# Patient Record
Sex: Female | Born: 1970 | Race: White | Hispanic: No | Smoking: Current every day smoker
Health system: Southern US, Community
[De-identification: ages and names within clinical notes are randomized; demographics above are authoritative.]

## PROBLEM LIST (undated history)

## (undated) DIAGNOSIS — M419 Scoliosis, unspecified: Secondary | ICD-10-CM

## (undated) DIAGNOSIS — K219 Gastro-esophageal reflux disease without esophagitis: Secondary | ICD-10-CM

## (undated) HISTORY — PX: TONSILLECTOMY: SUR1361

---

## 2001-01-20 ENCOUNTER — Emergency Department (HOSPITAL_COMMUNITY): Admission: EM | Admit: 2001-01-20 | Discharge: 2001-01-20 | Payer: Self-pay | Admitting: Emergency Medicine

## 2001-01-20 ENCOUNTER — Encounter: Payer: Self-pay | Admitting: *Deleted

## 2002-11-16 ENCOUNTER — Emergency Department (HOSPITAL_COMMUNITY): Admission: EM | Admit: 2002-11-16 | Discharge: 2002-11-16 | Payer: Self-pay | Admitting: Emergency Medicine

## 2004-08-05 ENCOUNTER — Other Ambulatory Visit: Admission: RE | Admit: 2004-08-05 | Discharge: 2004-08-05 | Payer: Self-pay

## 2005-09-15 ENCOUNTER — Other Ambulatory Visit: Admission: RE | Admit: 2005-09-15 | Discharge: 2005-09-15 | Payer: Self-pay | Admitting: Unknown Physician Specialty

## 2005-09-15 ENCOUNTER — Encounter (INDEPENDENT_AMBULATORY_CARE_PROVIDER_SITE_OTHER): Payer: Self-pay | Admitting: *Deleted

## 2005-09-15 ENCOUNTER — Encounter (INDEPENDENT_AMBULATORY_CARE_PROVIDER_SITE_OTHER): Payer: Self-pay | Admitting: Specialist

## 2007-07-04 ENCOUNTER — Emergency Department (HOSPITAL_COMMUNITY): Admission: EM | Admit: 2007-07-04 | Discharge: 2007-07-05 | Payer: Self-pay | Admitting: Emergency Medicine

## 2009-04-03 ENCOUNTER — Emergency Department (HOSPITAL_COMMUNITY): Admission: EM | Admit: 2009-04-03 | Discharge: 2009-04-04 | Payer: Self-pay | Admitting: Emergency Medicine

## 2010-04-14 LAB — POCT CARDIAC MARKERS
CKMB, poc: <1 ng/mL — ABNORMAL LOW (ref 1.0–8.0)
CKMB, poc: <1 ng/mL — ABNORMAL LOW (ref 1.0–8.0)
Myoglobin, poc: 31.8 ng/mL (ref 12–200)
Myoglobin, poc: 36.9 ng/mL (ref 12–200)
Troponin i, poc: <0.05 ng/mL (ref 0.00–0.09)
Troponin i, poc: <0.05 ng/mL (ref 0.00–0.09)

## 2010-09-10 ENCOUNTER — Other Ambulatory Visit (HOSPITAL_COMMUNITY): Payer: Self-pay | Admitting: Family Medicine

## 2010-09-10 DIAGNOSIS — Z139 Encounter for screening, unspecified: Secondary | ICD-10-CM

## 2010-09-25 ENCOUNTER — Inpatient Hospital Stay (HOSPITAL_COMMUNITY): Admission: RE | Admit: 2010-09-25 | Payer: Self-pay | Source: Ambulatory Visit

## 2010-12-18 ENCOUNTER — Emergency Department (HOSPITAL_COMMUNITY): Payer: Self-pay

## 2010-12-18 ENCOUNTER — Encounter: Payer: Self-pay | Admitting: Emergency Medicine

## 2010-12-18 ENCOUNTER — Emergency Department (HOSPITAL_COMMUNITY)
Admission: EM | Admit: 2010-12-18 | Discharge: 2010-12-18 | Disposition: A | Discharge status: Home / Self Care | Payer: Self-pay | Attending: Emergency Medicine | Admitting: Emergency Medicine

## 2010-12-18 DIAGNOSIS — R079 Chest pain, unspecified: Secondary | ICD-10-CM | POA: Insufficient documentation

## 2010-12-18 DIAGNOSIS — J029 Acute pharyngitis, unspecified: Secondary | ICD-10-CM | POA: Insufficient documentation

## 2010-12-18 DIAGNOSIS — R062 Wheezing: Secondary | ICD-10-CM | POA: Insufficient documentation

## 2010-12-18 DIAGNOSIS — J40 Bronchitis, not specified as acute or chronic: Secondary | ICD-10-CM | POA: Insufficient documentation

## 2010-12-18 DIAGNOSIS — R059 Cough, unspecified: Secondary | ICD-10-CM | POA: Insufficient documentation

## 2010-12-18 DIAGNOSIS — R05 Cough: Secondary | ICD-10-CM | POA: Insufficient documentation

## 2010-12-18 DIAGNOSIS — IMO0001 Reserved for inherently not codable concepts without codable children: Secondary | ICD-10-CM | POA: Insufficient documentation

## 2010-12-18 DIAGNOSIS — R509 Fever, unspecified: Secondary | ICD-10-CM | POA: Insufficient documentation

## 2010-12-18 DIAGNOSIS — J3489 Other specified disorders of nose and nasal sinuses: Secondary | ICD-10-CM | POA: Insufficient documentation

## 2010-12-18 DIAGNOSIS — R0602 Shortness of breath: Secondary | ICD-10-CM | POA: Insufficient documentation

## 2010-12-18 MED ORDER — HYDROCOD POLST-CHLORPHEN POLST 10-8 MG/5ML PO LQCR: 5.0000 mL | Freq: Two times a day (BID) | ORAL | Status: DC

## 2010-12-18 MED ORDER — AZITHROMYCIN 250 MG PO TABS: 500.0000 mg | ORAL_TABLET | Freq: Once | ORAL | Status: AC

## 2010-12-18 NOTE — ED Provider Notes (Signed)
History     CSN: [id number] Arrival date & time: 12/18/2010  8:15 AM   First MD Initiated Contact with Patient 12/18/10 0830      Chief Complaint  Patient presents with  . Cough    (Consider location/radiation/quality/duration/timing/severity/associated sxs/prior treatment) HPI Comments: Patient here with worsening shortness of breath, cough and back and chest pain for the past 2 weeks - states that she has been trying her inhaler at home without relief of symptoms - states that she believes she had a fever about 3 days ago - none now.  Patient is a 40 y.o. female presenting with cough. The history is provided by the patient. No language interpreter was used.  Cough This is a new problem. The current episode started more than 1 week ago. The problem occurs every few hours. The problem has been gradually worsening. The cough is productive of sputum. The maximum temperature recorded prior to her arrival was 100 to 100.9 F. The fever has been present for 1 to 2 days. Associated symptoms include chest pain, rhinorrhea, sore throat, myalgias, shortness of breath and wheezing. Pertinent negatives include no chills, no sweats, no weight loss, no ear congestion and no headaches. She has tried cough syrup for the symptoms. The treatment provided no relief. She is a smoker. Her past medical history is significant for COPD and asthma.    No past medical history on file.  No past surgical history on file.  No family history on file.  History  Substance Use Topics  . Smoking status: Not on file  . Smokeless tobacco: Not on file  . Alcohol Use: Not on file    OB History    Grav Para Term Preterm Abortions TAB SAB Ect Mult Living                  Review of Systems  Constitutional: Negative for chills and weight loss.  HENT: Positive for sore throat and rhinorrhea.   Respiratory: Positive for cough, shortness of breath and wheezing.   Cardiovascular: Positive for chest pain.    Musculoskeletal: Positive for myalgias.  Neurological: Negative for headaches.  All other systems reviewed and are negative.    Allergies  Review of patient's allergies indicates no known allergies.  Home Medications   Current Outpatient Rx  Name Route Sig Dispense Refill  . IPRATROPIUM-ALBUTEROL 0.5-2.5 (3) MG/3ML IN SOLN Nebulization Take 3 mLs by nebulization every 6 (six) hours as needed. For shortness of breath     . ORTHO TRI-CYCLEN (28) PO Oral Take 1 tablet by mouth daily.      Doreatha Martin MULTI-SYMPTOM PO Oral Take 2 tablets by mouth at bedtime as needed. For cough/cold symptoms       BP 101/63  Pulse 73  Temp(Src) 97.7 F (36.5 C) (Oral)  Resp 16  Ht 5\' 11"  (1.803 m)  Wt 148 lb (67.132 kg)  BMI 20.64 kg/m2  SpO2 96%  LMP 08/17/2010  Physical Exam  Nursing note and vitals reviewed. Constitutional: She is oriented to person, place, and time. She appears well-developed and well-nourished.  HENT:  Head: Normocephalic and atraumatic.  Right Ear: External ear normal.  Left Ear: External ear normal.  Mouth/Throat: Oropharynx is clear and moist.  Eyes: Conjunctivae are normal. Pupils are equal, round, and reactive to light. No scleral icterus.  Neck: Normal range of motion. Neck supple.  Cardiovascular: Normal rate, regular rhythm, normal heart sounds and intact distal pulses.   Pulmonary/Chest: Effort normal and breath sounds  normal. No respiratory distress. She has no wheezes. She has no rales. She exhibits tenderness.  Abdominal: Soft. Bowel sounds are normal. There is no tenderness.  Musculoskeletal: Normal range of motion. She exhibits no edema and no tenderness.  Neurological: She is alert and oriented to person, place, and time. No cranial nerve deficit.  Skin: Skin is warm and dry. She is not diaphoretic.  Psychiatric: She has a normal mood and affect. Her behavior is normal. Judgment and thought content normal.    ED Course  Procedures (including critical  care time)  Labs Reviewed - No data to display Dg Chest 2 View  12/18/2010  *RADIOLOGY REPORT*  Clinical Data: Cough, fever, posterior chest pain, smoker  CHEST - 2 VIEW  Comparison: 04/04/2009  Findings: Normal heart size, mediastinal contours, and pulmonary vascularity. Hyperinflation and mild chronic peribronchial thickening. No pulmonary infiltrate, pleural effusion, or pneumothorax. No acute osseous findings.  IMPRESSION: Hyperinflation and chronic bronchitic changes, question COPD. No acute infiltrate.  Original Report Authenticated By: Lollie Marrow, M.D.     Bronchitis   MDM  Heavy smoker with a history of COPD presents with worsening cough over past 2 weeks - chest x-ray shows no pneumonia, bronchitis - placed on z-pak and tussionex for cough        Scarlette Calico C. [place], [place] 12/18/10 1101

## 2010-12-18 NOTE — ED Provider Notes (Signed)
Medical screening examination/treatment/procedure(s) were performed by non-physician practitioner and as supervising physician I was immediately available for consultation/collaboration.   Dorette Hartel L Masiah Lewing, MD 12/18/10 1500 

## 2010-12-18 NOTE — ED Notes (Signed)
Pt states for past two weeks she has been coughing with some fevers and now has pain in her middle back.

## 2011-10-08 ENCOUNTER — Other Ambulatory Visit (HOSPITAL_COMMUNITY): Payer: Self-pay | Admitting: Nurse Practitioner

## 2011-10-08 DIAGNOSIS — Z139 Encounter for screening, unspecified: Secondary | ICD-10-CM

## 2011-10-19 ENCOUNTER — Ambulatory Visit (HOSPITAL_COMMUNITY)
Admission: RE | Admit: 2011-10-19 | Discharge: 2011-10-19 | Disposition: A | Discharge status: Home / Self Care | Payer: PRIVATE HEALTH INSURANCE | Source: Ambulatory Visit | Attending: Nurse Practitioner | Admitting: Nurse Practitioner

## 2011-10-19 DIAGNOSIS — Z139 Encounter for screening, unspecified: Secondary | ICD-10-CM

## 2013-03-13 ENCOUNTER — Emergency Department (HOSPITAL_COMMUNITY)
Admission: EM | Admit: 2013-03-13 | Discharge: 2013-03-13 | Disposition: A | Discharge status: Home / Self Care | Payer: Self-pay | Attending: Emergency Medicine | Admitting: Emergency Medicine

## 2013-03-13 ENCOUNTER — Encounter (HOSPITAL_COMMUNITY): Payer: Self-pay | Admitting: Emergency Medicine

## 2013-03-13 DIAGNOSIS — F17200 Nicotine dependence, unspecified, uncomplicated: Secondary | ICD-10-CM | POA: Insufficient documentation

## 2013-03-13 DIAGNOSIS — Z79899 Other long term (current) drug therapy: Secondary | ICD-10-CM | POA: Insufficient documentation

## 2013-03-13 DIAGNOSIS — M545 Low back pain, unspecified: Secondary | ICD-10-CM | POA: Insufficient documentation

## 2013-03-13 DIAGNOSIS — G8929 Other chronic pain: Secondary | ICD-10-CM | POA: Insufficient documentation

## 2013-03-13 DIAGNOSIS — M549 Dorsalgia, unspecified: Secondary | ICD-10-CM

## 2013-03-13 HISTORY — DX: Scoliosis, unspecified: M41.9

## 2013-03-13 MED ORDER — HYDROCODONE-ACETAMINOPHEN 5-325 MG PO TABS: 1.0000 | ORAL_TABLET | ORAL | Status: DC | PRN

## 2013-03-13 MED ORDER — CYCLOBENZAPRINE HCL 5 MG PO TABS: 5.0000 mg | ORAL_TABLET | Freq: Three times a day (TID) | ORAL | Status: DC | PRN

## 2013-03-13 NOTE — Discharge Instructions (Signed)
Chronic Back Pain  When back pain lasts longer than 3 months, it is called chronic back pain.People with chronic back pain often go through certain periods that are more intense (flare-ups).  CAUSES Chronic back pain can be caused by wear and tear (degeneration) on different structures in your back. These structures include:  The bones of your spine (vertebrae) and the joints surrounding your spinal cord and nerve roots (facets).  The strong, fibrous tissues that connect your vertebrae (ligaments). Degeneration of these structures may result in pressure on your nerves. This can lead to constant pain. HOME CARE INSTRUCTIONS  Avoid bending, heavy lifting, prolonged sitting, and activities which make the problem worse.  Take brief periods of rest throughout the day to reduce your pain. Lying down or standing usually is better than sitting while you are resting.  Take over-the-counter or prescription medicines only as directed by your caregiver. SEEK IMMEDIATE MEDICAL CARE IF:   You have weakness or numbness in one of your legs or feet.  You have trouble controlling your bladder or bowels.  You have nausea, vomiting, abdominal pain, shortness of breath, or fainting. Document Released: 02/13/2004 Document Revised: 03/30/2011 Document Reviewed: 12/20/2010 [facility] Patient Information 2014 [place], [place].    Emergency Department Resource Guide 1) Find a Doctor and Pay Out of Pocket Although you won't have to find out who is covered by your insurance plan, it is a good idea to ask around and get recommendations. You will then need to call the office and see if the doctor you have chosen will accept you as a new patient and what types of options they offer for patients who are self-pay. Some doctors offer discounts or will set up payment plans for their patients who do not have insurance, but you will need to ask so you aren't surprised when you get to your appointment.  2) Contact Your  Local Health Department Not all health departments have doctors that can see patients for sick visits, but many do, so it is worth a call to see if yours does. If you don't know where your local health department is, you can check in your phone book. The CDC also has a tool to help you locate your state's health department, and many state websites also have listings of all of their local health departments.  3) Find a [facility] If your illness is not likely to be very severe or complicated, you may want to try a walk in clinic. These are popping up all over the country in pharmacies, drugstores, and shopping centers. They're usually staffed by nurse practitioners or physician assistants that have been trained to treat common illnesses and complaints. They're usually fairly quick and inexpensive. However, if you have serious medical issues or chronic medical problems, these are probably not your best option.  No Primary Care Doctor: - Call Health Connect at  ([phone] - they can help you locate a primary care doctor that  accepts your insurance, provides certain services, etc. - Physician Referral Service- [phone]  Chronic Pain Problems: Organization         Address  Phone   Notes  [facility]  [phone] Patients need to be referred by their primary care doctor.   Medication Assistance: Organization         Address  Phone   Notes  [facility] Medication [facility] [address] [place], [place] [address] [phone] --Must be a resident of [facility] --  Must have NO insurance coverage whatsoever (no Medicaid/ Medicare, etc.) °-- The pt. MUST have a primary care doctor that directs their care regularly and follows them in the community °  °MedAssist  (866) 331-1348   °[employer]  (888) 892-1162   ° °Agencies that provide inexpensive medical care: °Organization         Address  Phone   Notes  °[facility]  (336) 832-8035    °[facility]    (336) 832-7272   °[facility] [address] °[place], [name] [address] (336) 832-4777   °[facility] [place] [address], °[place] (336) 271-4999   °[facility]    (336) 373-0678   °[facility]    (336) 272-1050   °[facility] ° [address], [place] Phone:  (336) 832-4444, Fax:  (336) 832-4440 Hours of Operation:  9 am - 6 pm, M-F.  Also accepts Medicaid/Medicare and self-pay.  °[facility] ° [address], [place] Phone: (336) [phone], Fax: (336) [phone]. Hours of Operation:  8:30 am - 5:30 pm, M-F.  Also accepts Medicaid and self-pay.  °[facility] [place] [address], [place] Phone: (336) 878-6027   °[facility] [address], [place], [place] (336)723-1848, Ext. 123 Mondays & Thursdays: 7-9 AM.  First 15 patients are seen on a first come, first serve basis. °  ° °Medicaid-accepting [place] Providers: ° °Organization         Address  Phone   Notes  °[facility] [address], [place] ([phone]) [phone] Also accepts self-pay patients.  °[facility] [address], [place] ° (336) 856-9996   °[facility] [address], [place] (336) 288-8857   °[facility] [address] [place] [address], [place] (336) 299-7000   °Ve[facility] Bland [address], [place]  ° (336) [phone] Only accepts [place] Access Medicaid patients after they have their name applied to their card.  ° °Self-Pay (no insurance) in [place]: ° °Organization         Address  Phone   Notes  °Sickle Cell Patients, [facility] [address], [place] (336) 832-1970   °[facility] [address], [place] (336) 832-4400   °[facility] ° [address],  [place] (336) 992-4800   °[facility]/Dr. Osei-Bonsu ° [address] [place] [address], [place] or [address], [place] (336) [phone] Phone number for both [place] and [place] locations is the same.  °[facility] [address], [place] (336) [phone]   °[facility] [address] [place] [address], [place] or [address] (336) 852-7530 °(336) 878-2260   °[facility] [address],  (336) 350-1642, phone; (336) 294-5005, fax Sees patients 1st and 3rd Saturday of every month.  Must not qualify for public or private insurance (i.e. Medicaid, Medicare, [place] Health Choice, Veterans' Benefits) • Household income should be no more than 200% of the poverty level •The clinic cannot treat you if you are pregnant or think you are pregnant • Sexually transmitted diseases are not treated at the clinic.  ° ° °Dental Care: °Organization         Address  Phone  Notes  °  [place] [facility] [address], [place] (336) 641-6152 Accepts children up to age 21 who are enrolled in Medicaid or [place] Health Choice; pregnant women with a Medicaid card; and children who have applied for Medicaid or [place] Health Choice, but were declined, whose parents can pay a reduced fee at time of service.  °[place] [facility] [place]  [address], [place] (336) 641-7733 Accepts children up to age 21 who are enrolled in Medicaid or [place] Health Choice; pregnant women with a Medicaid card; and children who have applied for Medicaid or [place] Health Choice, but were declined, whose parents can pay a reduced fee at time of service.  °[facility] ° [address], [place] (336) 641-4533 Patients are seen by appointment only. Walk-ins are not accepted. [facility] will see patients 18 years of age and older. °Monday - Tuesday (8am-5pm) °Most Wednesdays  (8:30-5pm) °$30 per visit, cash only  °[facility] ° [address], [place] (336) 641-4533 Patients are seen by appointment only. Walk-ins are not accepted. [facility] will see patients 18 years of age and older. °One Wednesday Evening (Monthly: Volunteer Based).  $30 per visit, cash only  °[facility]  (919) [phone] for adults; Children under age 4, call Graduate Pediatric Dentistry at ([phone]) [phone]. Children aged 4-14, please call ([phone]) [phone] to request a pediatric application. ° Dental services are provided in all areas of dental care including fillings, crowns and bridges, complete and partial dentures, implants, gum treatment, root canals, and extractions. Preventive care is also provided. Treatment is provided to both adults and children. °Patients are selected via a lottery and there is often a waiting list. °  °[facility] [address], °[place] ° (336) 763-8833 [email or web address] °  °[facility] [address], [place], [place] (336)723-1848, Ext. 123 Second and Fourth Thursday of each month, opens at 6:30 AM; Clinic ends at 9 AM.  Patients are seen on a first-come first-served basis, and a limited number are seen during each clinic.  ° °[facility] ° [address], [place], [place] (336) [phone]   Eligibility Requirements °You must have lived in [place], [place], or [place] counties for at least the last three months. °  You cannot be eligible for state or federal sponsored healthcare insurance, including Veterans Administration, Medicaid, or Medicare. °  You generally cannot be eligible for healthcare insurance through your employer.  °  How to apply: °Eligibility screenings are held every Tuesday and Wednesday afternoon from 1:00 pm until 4:00 pm. You do not need an appointment for the interview!  °[facility] [address], [place], [place] 336-631-2330   °[place]  [facility]  336-342-8273   °[place] [facility]  336-703-3100   °[facility]  336-570-6415   ° °Behavioral Health Resources in the Community: °Intensive Outpatient Programs °Organization         Address  Phone  Notes  °[facility] [address], [place], [place] 336-878-6098   °[facility] [address], [place], [place] 336-832-9800   °[facility] [address], [place], [place] ° 336-882-2125   °[place] [address],  °,  1-800-853-5163 or 336-641-4981   °Substance Abuse Resources °Organization           Address  Phone  Notes  [facility]  443-850-0693   [place]  8571590729   [facility]  724-776-5010   Chinita Pester  845-231-6061   Residential & Outpatient Substance Abuse Program  917 117 6021   Psychological Services Organization         Address  Phone  Notes  [facility] [place]  [place]  (801)833-6500   [place] [address] [place] or (616)624-8946    Mobile Crisis Teams Organization         Address  Phone  Notes  [facility], Mobile Crisis Care Unit  325-818-5470   [facility]  [address] [place], [place]   [place] [address] [place] [phone]    Self-Help/Support Groups Organization         Address  Phone             Notes  [place] [place] - variety of support groups  [place] Call for more information  Narcotics Anonymous (NA), [facility] [address], [employer]  2 meetings at this location   Special educational needs teacher         Address  Phone  Notes  [facility] [place],    [place]  1-641-664-4066   [facility]  [address], [place]  [address], [place], [place]   [place], [place] [phone] Admissions: 8am-3pm M-F  [facility] [place] [address]    [place], [place] [phone]   [facility] [address] [place], [place], [place]   [facility] [address]  [place], [place]   [facility] - Intensive Outpatient [address], [place], [place]   [facility] ([address]  [place], [place] 1-(321)477-6150 or (616)615-0335   [facility] ([facility]) [address], [place], [place] Accepts Medicaid  [facility] [place] [address],  [place] 1-934-659-5904 Substance Abuse/Addiction Treatment   [facility] Organization         Address  Phone  Notes  [facility]  223-603-9684   Domenic Schwab, PhD [address] [place], [place]   (279) 629-4833 or [phone]   [place], [place] (205) 776-1576   [facility] [address] [place], [place] [phone] Insurance/Medicaid/sponsorship through [facility] [address]                                    [place], [place] [phone] [place] [address], [place] [phone]    Dr. Adele Schilder  365-267-8854   [facility] Dept. 1) [address], [place] 2) [place] 3)  [address], [place] [phone]  [phone]   [place] (434)[phone] or 512-107-7590 (After Hours)          Do not drive within 4 hours of taking hydrocodone as this will make you drowsy.  Avoid lifting,  Bending,  Twisting or any other activity that worsens your pain over the next  week.  Apply a heating pad to your neck and shoulders for 20 minutes several times daily.

## 2013-03-13 NOTE — ED Notes (Signed)
Pt states she was dx with scoliosis x 14 years ago and has not done anything about it. C/o chronic neck/back/hip pain.

## 2013-03-15 NOTE — ED Provider Notes (Signed)
CSN: [id number]     Arrival date & time 03/13/13  1701 History   First MD Initiated Contact with Patient 03/13/13 1839     Chief Complaint  Patient presents with  . Scoliosis     (Consider location/radiation/quality/duration/timing/severity/associated sxs/prior Treatment) HPI Comments: Suzanne Baker is a 43 y.o. Female with a history of chronic low back pain which she states is from scoliosis, diagnosed approximately 14 years ago.  She denies chronic daily pain in her neck, lower back with radiation into her lower buttocks when it gets severe.  Her pain is worse with movement and certain positions.  She takes occasional ibuprofen which does not relieve her pain.  She denies fevers or chills, weakness or numbness in her legs and has had no urinary or bowel incontinence or retention.  She denies any new symptoms tonight, she is just tired of hurting.  She does not have a primary doctor.    The history is provided by the patient.    Past Medical History  Diagnosis Date  . Scoliosis    Past Surgical History  Procedure Laterality Date  . Tonsillectomy     No family history on file. History  Substance Use Topics  . Smoking status: Current Every Day Smoker -- 1.00 packs/day    Types: Cigarettes  . Smokeless tobacco: Not on file  . Alcohol Use: No   OB History   Grav Para Term Preterm Abortions TAB SAB Ect Mult Living                 Review of Systems  Constitutional: Negative for fever.  Respiratory: Negative for shortness of breath.   Cardiovascular: Negative for chest pain and leg swelling.  Gastrointestinal: Negative for abdominal pain, constipation and abdominal distention.  Genitourinary: Negative for dysuria, urgency, frequency, flank pain and difficulty urinating.  Musculoskeletal: Positive for back pain. Negative for gait problem and joint swelling.  Skin: Negative for rash.  Neurological: Negative for weakness and numbness.      Allergies  Review of patient's  allergies indicates no known allergies.  Home Medications   Current Outpatient Rx  Name  Route  Sig  Dispense  Refill  . Norgestim-Eth Estrad Triphasic (ORTHO TRI-CYCLEN, 28, PO)   Oral   Take 1 tablet by mouth daily.           . cyclobenzaprine (FLEXERIL) 5 MG tablet   Oral   Take 1 tablet (5 mg total) by mouth 3 (three) times daily as needed for muscle spasms.   15 tablet   0   . HYDROcodone-acetaminophen (NORCO/VICODIN) 5-325 MG per tablet   Oral   Take 1 tablet by mouth every 4 (four) hours as needed for moderate pain.   20 tablet   0    BP 122/88  Pulse 77  Temp(Src) 98 F (36.7 C) (Oral)  Resp 18  Ht 5' 6.5" (1.689 m)  Wt 165 lb (74.844 kg)  BMI 26.24 kg/m2  SpO2 98%  LMP 03/06/2013 Physical Exam  Nursing note and vitals reviewed. Constitutional: She appears well-developed and well-nourished.  HENT:  Head: Normocephalic.  Eyes: Conjunctivae are normal.  Neck: Normal range of motion. Neck supple.  Cardiovascular: Normal rate and intact distal pulses.   Pedal pulses normal.  Pulmonary/Chest: Effort normal.  Abdominal: Soft. Bowel sounds are normal. She exhibits no distension and no mass.  Musculoskeletal: Normal range of motion. She exhibits no edema.       Thoracic back: She exhibits tenderness. She exhibits  no swelling, no edema and no spasm.       Lumbar back: She exhibits tenderness. She exhibits no swelling, no edema and no spasm.  paralumbar and parathoracic tenderness.   Neurological: She is alert. She has normal strength. She displays no atrophy and no tremor. No sensory deficit. Gait normal.  Reflex Scores:      Patellar reflexes are 2+ on the right side and 2+ on the left side.      Achilles reflexes are 2+ on the right side and 2+ on the left side. No strength deficit noted in hip and knee flexor and extensor muscle groups.  Ankle flexion and extension intact.  Skin: Skin is warm and dry.  Psychiatric: She has a normal mood and affect.    ED  Course  Procedures (including critical care time) Labs Review Labs Reviewed - No data to display Imaging Review No results found.  EKG Interpretation   None       MDM   Final diagnoses:  Chronic back pain    Pt advised she needs to obtain a pcp.  She was prescribed flexeril, hydrocodone.  Encouraged ibuprofen 600 mg qid (pt has). Heat therapy.  No neuro deficit on exam or by history to suggest emergent or surgical presentation.  Also discussed worsened sx that should prompt immediate re-evaluation including distal weakness, bowel/bladder retention/incontinence.         Evalee Jefferson, PA-C 03/15/13 2354

## 2013-03-17 NOTE — ED Provider Notes (Signed)
Medical screening examination/treatment/procedure(s) were performed by non-physician practitioner and as supervising physician I was immediately available for consultation/collaboration.  EKG Interpretation  None  Rolland Porter, MD, Abram Sander   Janice Norrie, MD 03/17/13 [phone]

## 2014-11-05 ENCOUNTER — Other Ambulatory Visit (HOSPITAL_COMMUNITY): Payer: Self-pay | Admitting: Nurse Practitioner

## 2014-11-05 DIAGNOSIS — Z1231 Encounter for screening mammogram for malignant neoplasm of breast: Secondary | ICD-10-CM

## 2014-11-12 ENCOUNTER — Ambulatory Visit (HOSPITAL_COMMUNITY): Payer: Self-pay

## 2016-08-13 ENCOUNTER — Emergency Department (HOSPITAL_COMMUNITY)
Admission: EM | Admit: 2016-08-13 | Discharge: 2016-08-13 | Disposition: A | Discharge status: Home / Self Care | Payer: Self-pay | Attending: Emergency Medicine | Admitting: Emergency Medicine

## 2016-08-13 ENCOUNTER — Encounter (HOSPITAL_COMMUNITY): Payer: Self-pay | Admitting: Emergency Medicine

## 2016-08-13 ENCOUNTER — Emergency Department (HOSPITAL_COMMUNITY): Payer: Self-pay

## 2016-08-13 DIAGNOSIS — R112 Nausea with vomiting, unspecified: Secondary | ICD-10-CM | POA: Insufficient documentation

## 2016-08-13 DIAGNOSIS — Z793 Long term (current) use of hormonal contraceptives: Secondary | ICD-10-CM | POA: Insufficient documentation

## 2016-08-13 DIAGNOSIS — F17210 Nicotine dependence, cigarettes, uncomplicated: Secondary | ICD-10-CM | POA: Insufficient documentation

## 2016-08-13 DIAGNOSIS — R1010 Upper abdominal pain, unspecified: Secondary | ICD-10-CM | POA: Insufficient documentation

## 2016-08-13 LAB — URINALYSIS, ROUTINE W REFLEX MICROSCOPIC
Glucose, UA: NEGATIVE mg/dL
HGB URINE DIPSTICK: NEGATIVE
Ketones, ur: 5 mg/dL — AB
LEUKOCYTES UA: NEGATIVE
NITRITE: NEGATIVE
PH: 5 (ref 5.0–8.0)
Protein, ur: 30 mg/dL — AB
SPECIFIC GRAVITY, URINE: 1.028 (ref 1.005–1.030)

## 2016-08-13 LAB — CBC
HCT: 41 % (ref 36.0–46.0)
Hemoglobin: 14.3 g/dL (ref 12.0–15.0)
MCH: 32.7 pg (ref 26.0–34.0)
MCHC: 34.9 g/dL (ref 30.0–36.0)
MCV: 93.8 fL (ref 78.0–100.0)
PLATELETS: 266 10*3/uL (ref 150–400)
RBC: 4.37 MIL/uL (ref 3.87–5.11)
RDW: 13.1 % (ref 11.5–15.5)
WBC: 11.2 10*3/uL — AB (ref 4.0–10.5)

## 2016-08-13 LAB — LIPASE, BLOOD: LIPASE: 37 U/L (ref 11–51)

## 2016-08-13 LAB — COMPREHENSIVE METABOLIC PANEL
ALT: 9 U/L — ABNORMAL LOW (ref 14–54)
AST: 15 U/L (ref 15–41)
Albumin: 4.2 g/dL (ref 3.5–5.0)
Alkaline Phosphatase: 76 U/L (ref 38–126)
Anion gap: 9 (ref 5–15)
BUN: 17 mg/dL (ref 6–20)
CHLORIDE: 105 mmol/L (ref 101–111)
CO2: 26 mmol/L (ref 22–32)
Calcium: 9 mg/dL (ref 8.9–10.3)
Creatinine, Ser: 0.89 mg/dL (ref 0.44–1.00)
GFR calc Af Amer: >60 mL/min (ref >60)
Glucose, Bld: 102 mg/dL — ABNORMAL HIGH (ref 65–99)
POTASSIUM: 3.6 mmol/L (ref 3.5–5.1)
SODIUM: 140 mmol/L (ref 135–145)
Total Bilirubin: 0.7 mg/dL (ref 0.3–1.2)
Total Protein: 7.5 g/dL (ref 6.5–8.1)

## 2016-08-13 LAB — I-STAT CHEM 8, ED
BUN: 17 mg/dL (ref 6–20)
CALCIUM ION: 1.07 mmol/L — AB (ref 1.15–1.40)
CREATININE: 0.8 mg/dL (ref 0.44–1.00)
Chloride: 103 mmol/L (ref 101–111)
Glucose, Bld: 99 mg/dL (ref 65–99)
HEMATOCRIT: 44 % (ref 36.0–46.0)
Hemoglobin: 15 g/dL (ref 12.0–15.0)
Potassium: 3.5 mmol/L (ref 3.5–5.1)
Sodium: 141 mmol/L (ref 135–145)
TCO2: 26 mmol/L (ref 0–100)

## 2016-08-13 MED ORDER — SODIUM CHLORIDE 0.9 % IV BOLUS (SEPSIS)
1000.0000 mL | Freq: Once | INTRAVENOUS | Status: AC
  Administered 2016-08-13: 1000 mL via INTRAVENOUS

## 2016-08-13 MED ORDER — ONDANSETRON 4 MG PO TBDP: ORAL_TABLET | ORAL | 0 refills | Status: DC

## 2016-08-13 MED ORDER — ONDANSETRON HCL 4 MG/2ML IJ SOLN
4.0000 mg | Freq: Once | INTRAMUSCULAR | Status: AC
  Administered 2016-08-13: 4 mg via INTRAVENOUS
  Filled 2016-08-13: qty 2

## 2016-08-13 MED ORDER — FAMOTIDINE IN NACL 20-0.9 MG/50ML-% IV SOLN
20.0000 mg | Freq: Once | INTRAVENOUS | Status: AC
  Administered 2016-08-13: 20 mg via INTRAVENOUS
  Filled 2016-08-13: qty 50

## 2016-08-13 MED ORDER — GI COCKTAIL ~~LOC~~
30.0000 mL | Freq: Once | ORAL | Status: AC
  Administered 2016-08-13: 30 mL via ORAL
  Filled 2016-08-13: qty 30

## 2016-08-13 MED ORDER — GLUCAGON HCL RDNA (DIAGNOSTIC) 1 MG IJ SOLR
2.0000 mg | Freq: Once | INTRAMUSCULAR | Status: AC
  Administered 2016-08-13: 2 mg via INTRAVENOUS
  Filled 2016-08-13: qty 2

## 2016-08-13 NOTE — ED Provider Notes (Signed)
[facility] DEPT Provider Note   CSN: [id number] Arrival date & time: 08/13/16  0904     History   Chief Complaint Chief Complaint  Patient presents with  . Emesis  . Abdominal Pain    HPI Suzanne Baker is a 46 y.o. female.  46 yo F with a cc of chest pain. This occurs when she eats or drinks anything. Feels a burning sharp pain to her sternal area. Has had multiple episodes of vomiting as well. Denies fevers or chills. After she vomits she does get a bit sweaty. Denies exertional symptoms. Has not wanted to eat anything for the past couple days because she is worried it will make it worse. Is able to drink some fluids but thinks she's been vomiting that up as well. Has had similar pains in the past. Has tried a couple Zantac but thinks she vomited that as well.   The history is provided by the patient.  Emesis   Associated symptoms include abdominal pain. Pertinent negatives include no arthralgias, no chills, no fever, no headaches and no myalgias.  Abdominal Pain   Associated symptoms include nausea and vomiting. Pertinent negatives include fever, dysuria, headaches, arthralgias and myalgias.  Illness  This is a new problem. The current episode started 2 days ago. The problem occurs constantly. The problem has not changed since onset.Associated symptoms include abdominal pain. Pertinent negatives include no chest pain, no headaches and no shortness of breath. Nothing aggravates the symptoms. Nothing relieves the symptoms. She has tried nothing for the symptoms. The treatment provided no relief.    Past Medical History:  Diagnosis Date  . Scoliosis     There are no active problems to display for this patient.   Past Surgical History:  Procedure Laterality Date  . TONSILLECTOMY      OB History    No data available       Home Medications    Prior to Admission medications   Medication Sig Start Date End Date Taking? Authorizing Provider  naproxen sodium (ANAPROX)  220 MG tablet Take 220 mg by mouth 2 (two) times daily as needed (for pain).   Yes [provider]  Norgestim-Eth Estrad Triphasic (ORTHO TRI-CYCLEN, 28, PO) Take 1 tablet by mouth daily.     Yes [provider]  ranitidine (ZANTAC) 150 MG tablet Take 150-300 mg by mouth 2 (two) times daily as needed for heartburn.   Yes [provider]  ondansetron (ZOFRAN ODT) 4 MG disintegrating tablet 4mg  ODT q4 hours prn nausea/vomit 08/13/16   Deno Etienne, DO    Family History No family history on file.  Social History Social History  Substance Use Topics  . Smoking status: Current Every Day Smoker    Packs/day: 1.00    Types: Cigarettes  . Smokeless tobacco: Not on file  . Alcohol use No     Allergies   Patient has no known allergies.   Review of Systems Review of Systems  Constitutional: Negative for chills and fever.  HENT: Negative for congestion and rhinorrhea.   Eyes: Negative for redness and visual disturbance.  Respiratory: Negative for shortness of breath and wheezing.   Cardiovascular: Negative for chest pain and palpitations.  Gastrointestinal: Positive for abdominal pain, nausea and vomiting.  Genitourinary: Negative for dysuria and urgency.  Musculoskeletal: Negative for arthralgias and myalgias.  Skin: Negative for pallor and wound.  Neurological: Negative for dizziness and headaches.     Physical Exam Updated Vital Signs BP (!) 138/103 (BP  Location: Left Arm)   Pulse 63   Temp 98.3 F (36.8 C) (Oral)   Resp 18   SpO2 99%   Physical Exam  Constitutional: She is oriented to person, place, and time. She appears well-developed and well-nourished. No distress.  HENT:  Head: Normocephalic and atraumatic.  Eyes: Pupils are equal, round, and reactive to light. EOM are normal.  Neck: Normal range of motion. Neck supple.  Cardiovascular: Normal rate and regular rhythm.  Exam reveals no gallop and no friction rub.   No murmur  heard. Pulmonary/Chest: Effort normal. She has no wheezes. She has no rales.  Abdominal: Soft. She exhibits no distension and no mass. There is no tenderness. There is no guarding.  Musculoskeletal: She exhibits no edema or tenderness.  Neurological: She is alert and oriented to person, place, and time.  Skin: Skin is warm and dry. She is not diaphoretic.  Psychiatric: She has a normal mood and affect. Her behavior is normal.  Nursing note and vitals reviewed.    ED Treatments / Results  Labs (all labs ordered are listed, but only abnormal results are displayed) Labs Reviewed  COMPREHENSIVE METABOLIC PANEL - Abnormal; Notable for the following:       Result Value   Glucose, Bld 102 (*)    ALT 9 (*)    All other components within normal limits  CBC - Abnormal; Notable for the following:    WBC 11.2 (*)    All other components within normal limits  URINALYSIS, ROUTINE W REFLEX MICROSCOPIC - Abnormal; Notable for the following:    Color, Urine AMBER (*)    APPearance CLOUDY (*)    Bilirubin Urine SMALL (*)    Ketones, ur 5 (*)    Protein, ur 30 (*)    Bacteria, UA RARE (*)    Squamous Epithelial / LPF TOO NUMEROUS TO COUNT (*)    All other components within normal limits  I-STAT CHEM 8, ED - Abnormal; Notable for the following:    Calcium, Ion 1.07 (*)    All other components within normal limits  LIPASE, BLOOD  I-STAT BETA HCG BLOOD, ED ([facility], [facility], [facility] ONLY)    EKG  EKG Interpretation  Date/Time:  Thursday August 13 2016 09:44:27 EDT Ventricular Rate:  60 PR Interval:    QRS Duration: 89 QT Interval:  450 QTC Calculation: 450 R Axis:   89 Text Interpretation:  Sinus rhythm No significant change since last tracing Confirmed by Deno Etienne [phone]) on 08/13/2016 9:48:50 AM       Radiology Dg Chest 2 View  Result Date: 08/13/2016 CLINICAL DATA:  Cough, shortness of breath, mid chest pain EXAM: CHEST  2 VIEW COMPARISON:  12/18/2010 FINDINGS: There is hyperinflation of the  lungs, stable. Heart and mediastinal contours are within normal limits. No focal opacities or effusions. No acute bony abnormality. IMPRESSION: Hyperinflation.  No active disease. Electronically Signed   By: Rolm Baptise M.D.   On: 08/13/2016 10:10    Procedures Procedures (including critical care time)  Discussed smoking cessation with patient and was they were offerred resources to help stop.  Total time was 5 min CPT code 99406.   Medications Ordered in ED Medications  gi cocktail (Maalox,Lidocaine,Donnatal) (30 mLs Oral Given 08/13/16 1030)  ondansetron (ZOFRAN) injection 4 mg (4 mg Intravenous Given 08/13/16 1030)  sodium chloride 0.9 % bolus 1,000 mL (0 mLs Intravenous Stopped 08/13/16 1127)  famotidine (PEPCID) IVPB 20 mg premix (20 mg Intravenous New Bag/Given 08/13/16 1206)  glucagon (human recombinant) (GLUCAGEN) injection 2 mg (2 mg Intravenous Given 08/13/16 1206)  ondansetron (ZOFRAN) injection 4 mg (4 mg Intravenous Given 08/13/16 1206)     Initial Impression / Assessment and Plan / ED Course  I have reviewed the triage vital signs and the nursing notes.  Pertinent labs & imaging results that were available during my care of the patient were reviewed by me and considered in my medical decision making (see chart for details).     46 yo F With a chief complaint of epigastric pain. Likely reflux by history. She does think that something got stuck however is been able to tolerate by mouth. Will give a GI cocktail check abdominal labs EKG chest x-ray.  The patient continues to feel like food gets stuck a certain point causes her severe pain and that she vomits. Patient was given glucagon with improvement of her symptoms. Was able to tolerate by mouth. We'll have her follow-up with [facility] urology.  1:42 PM:  I have discussed the diagnosis/risks/treatment options with the patient and family and believe the pt to be eligible for discharge home to follow-up with PCP. We also discussed  returning to the ED immediately if new or worsening sx occur. We discussed the sx which are most concerning (e.g., sudden worsening pain, fever, inability to tolerate by mouth) that necessitate immediate return. Medications administered to the patient during their visit and any new prescriptions provided to the patient are listed below.  Medications given during this visit Medications  gi cocktail (Maalox,Lidocaine,Donnatal) (30 mLs Oral Given 08/13/16 1030)  ondansetron (ZOFRAN) injection 4 mg (4 mg Intravenous Given 08/13/16 1030)  sodium chloride 0.9 % bolus 1,000 mL (0 mLs Intravenous Stopped 08/13/16 1127)  famotidine (PEPCID) IVPB 20 mg premix (20 mg Intravenous New Bag/Given 08/13/16 1206)  glucagon (human recombinant) (GLUCAGEN) injection 2 mg (2 mg Intravenous Given 08/13/16 1206)  ondansetron (ZOFRAN) injection 4 mg (4 mg Intravenous Given 08/13/16 1206)     The patient appears reasonably screen and/or stabilized for discharge and I doubt any other medical condition or other [facility] requiring further screening, evaluation, or treatment in the ED at this time prior to discharge.    Final Clinical Impressions(s) / ED Diagnoses   Final diagnoses:  Nausea and vomiting in adult    New Prescriptions New Prescriptions   ONDANSETRON (ZOFRAN ODT) 4 MG DISINTEGRATING TABLET    4mg  ODT q4 hours prn nausea/vomit     Deno Etienne, DO 08/13/16 1342

## 2016-08-13 NOTE — Discharge Instructions (Signed)
Try zantac 150mg  twice a day.   Return for inability to eat or drink.

## 2016-08-13 NOTE — ED Triage Notes (Signed)
Pt complaint of upper abd pain with associated n/v; pain only with vomiting; pain worse with/post eating.

## 2016-12-01 ENCOUNTER — Other Ambulatory Visit (HOSPITAL_COMMUNITY): Payer: Self-pay | Admitting: *Deleted

## 2016-12-01 DIAGNOSIS — IMO0002 Reserved for concepts with insufficient information to code with codable children: Secondary | ICD-10-CM

## 2016-12-01 DIAGNOSIS — R229 Localized swelling, mass and lump, unspecified: Principal | ICD-10-CM

## 2016-12-08 ENCOUNTER — Other Ambulatory Visit (HOSPITAL_COMMUNITY): Payer: Self-pay | Admitting: *Deleted

## 2016-12-08 ENCOUNTER — Ambulatory Visit (HOSPITAL_COMMUNITY): Payer: Self-pay

## 2016-12-08 DIAGNOSIS — IMO0002 Reserved for concepts with insufficient information to code with codable children: Secondary | ICD-10-CM

## 2016-12-08 DIAGNOSIS — R229 Localized swelling, mass and lump, unspecified: Principal | ICD-10-CM

## 2016-12-15 ENCOUNTER — Encounter (HOSPITAL_COMMUNITY): Payer: Self-pay

## 2016-12-15 ENCOUNTER — Ambulatory Visit (HOSPITAL_COMMUNITY)
Admission: RE | Admit: 2016-12-15 | Discharge: 2016-12-15 | Disposition: A | Discharge status: Home / Self Care | Payer: PRIVATE HEALTH INSURANCE | Source: Ambulatory Visit | Attending: *Deleted | Admitting: *Deleted

## 2016-12-15 ENCOUNTER — Ambulatory Visit (HOSPITAL_COMMUNITY): Payer: Self-pay

## 2016-12-15 DIAGNOSIS — N6323 Unspecified lump in the left breast, lower outer quadrant: Secondary | ICD-10-CM | POA: Diagnosis not present

## 2016-12-15 DIAGNOSIS — IMO0002 Reserved for concepts with insufficient information to code with codable children: Secondary | ICD-10-CM

## 2016-12-15 DIAGNOSIS — R229 Localized swelling, mass and lump, unspecified: Principal | ICD-10-CM

## 2016-12-16 ENCOUNTER — Other Ambulatory Visit (HOSPITAL_COMMUNITY): Payer: Self-pay | Admitting: *Deleted

## 2016-12-16 DIAGNOSIS — R229 Localized swelling, mass and lump, unspecified: Principal | ICD-10-CM

## 2016-12-16 DIAGNOSIS — IMO0002 Reserved for concepts with insufficient information to code with codable children: Secondary | ICD-10-CM

## 2017-01-05 ENCOUNTER — Ambulatory Visit (HOSPITAL_COMMUNITY)
Admission: RE | Admit: 2017-01-05 | Discharge: 2017-01-05 | Disposition: A | Discharge status: Home / Self Care | Payer: PRIVATE HEALTH INSURANCE | Source: Ambulatory Visit | Attending: *Deleted | Admitting: *Deleted

## 2017-01-05 ENCOUNTER — Other Ambulatory Visit (HOSPITAL_COMMUNITY): Payer: Self-pay

## 2017-01-05 ENCOUNTER — Other Ambulatory Visit (HOSPITAL_COMMUNITY): Payer: Self-pay | Admitting: *Deleted

## 2017-01-05 DIAGNOSIS — R229 Localized swelling, mass and lump, unspecified: Secondary | ICD-10-CM

## 2017-01-05 DIAGNOSIS — D242 Benign neoplasm of left breast: Secondary | ICD-10-CM | POA: Diagnosis not present

## 2017-01-05 DIAGNOSIS — N6320 Unspecified lump in the left breast, unspecified quadrant: Secondary | ICD-10-CM

## 2017-01-05 DIAGNOSIS — IMO0002 Reserved for concepts with insufficient information to code with codable children: Secondary | ICD-10-CM

## 2017-01-05 DIAGNOSIS — N6323 Unspecified lump in the left breast, lower outer quadrant: Secondary | ICD-10-CM | POA: Diagnosis present

## 2017-01-05 MED ORDER — LIDOCAINE-EPINEPHRINE (PF) 1 %-1:200000 IJ SOLN
INTRAMUSCULAR | Status: AC
  Administered 2017-01-05: 7 mL
  Filled 2017-01-05: qty 30

## 2017-01-05 MED ORDER — LIDOCAINE HCL (PF) 1 % IJ SOLN
INTRAMUSCULAR | Status: AC
  Administered 2017-01-05: 5 mL
  Filled 2017-01-05: qty 5

## 2018-07-20 ENCOUNTER — Other Ambulatory Visit: Payer: Self-pay

## 2018-07-20 DIAGNOSIS — Z20822 Contact with and (suspected) exposure to covid-19: Secondary | ICD-10-CM

## 2018-07-26 LAB — NOVEL CORONAVIRUS, NAA: SARS-CoV-2, NAA: NOT DETECTED

## 2018-07-27 ENCOUNTER — Telehealth: Payer: Self-pay | Admitting: Internal Medicine

## 2018-07-27 NOTE — Telephone Encounter (Signed)
Patient called on my cell to ask about status of COVID results.  Informed patient of negative results.  No current symptoms.

## 2019-01-09 ENCOUNTER — Other Ambulatory Visit (HOSPITAL_COMMUNITY): Payer: Self-pay | Admitting: Nurse Practitioner

## 2019-01-09 DIAGNOSIS — Z1231 Encounter for screening mammogram for malignant neoplasm of breast: Secondary | ICD-10-CM

## 2019-01-30 ENCOUNTER — Encounter (HOSPITAL_COMMUNITY): Payer: Self-pay

## 2019-02-06 ENCOUNTER — Encounter (HOSPITAL_COMMUNITY): Payer: Self-pay

## 2019-02-27 ENCOUNTER — Other Ambulatory Visit: Payer: Self-pay

## 2019-02-27 ENCOUNTER — Ambulatory Visit (HOSPITAL_COMMUNITY)
Admission: RE | Admit: 2019-02-27 | Discharge: 2019-02-27 | Disposition: A | Discharge status: Home / Self Care | Payer: Self-pay | Source: Ambulatory Visit | Attending: Nurse Practitioner | Admitting: Nurse Practitioner

## 2019-02-27 DIAGNOSIS — Z1231 Encounter for screening mammogram for malignant neoplasm of breast: Secondary | ICD-10-CM | POA: Insufficient documentation

## 2020-11-13 ENCOUNTER — Encounter: Payer: Self-pay | Admitting: Emergency Medicine

## 2020-11-13 ENCOUNTER — Ambulatory Visit (INDEPENDENT_AMBULATORY_CARE_PROVIDER_SITE_OTHER): Payer: Self-pay

## 2020-11-13 ENCOUNTER — Ambulatory Visit
Admission: EM | Admit: 2020-11-13 | Discharge: 2020-11-13 | Disposition: A | Discharge status: Home / Self Care | Payer: Self-pay | Attending: Family Medicine | Admitting: Family Medicine

## 2020-11-13 ENCOUNTER — Other Ambulatory Visit: Payer: Self-pay

## 2020-11-13 DIAGNOSIS — W19XXXA Unspecified fall, initial encounter: Secondary | ICD-10-CM

## 2020-11-13 DIAGNOSIS — M25531 Pain in right wrist: Secondary | ICD-10-CM

## 2020-11-13 MED ORDER — DICLOFENAC SODIUM 75 MG PO TBEC: 75.0000 mg | DELAYED_RELEASE_TABLET | Freq: Two times a day (BID) | ORAL | 0 refills | Status: DC

## 2020-11-13 NOTE — ED Triage Notes (Signed)
[address] off stool today and landed on right wrist.

## 2020-11-14 NOTE — ED Provider Notes (Signed)
Suzanne Baker   [id number] 11/13/20 Arrival Time: 1602  ASSESSMENT & PLAN:  1. Right wrist pain    I have personally viewed the imaging studies ordered this visit. No wrist fracture appreciated.  Wrist brace applied. Begin: Meds ordered this encounter  Medications   diclofenac (VOLTAREN) 75 MG EC tablet    Sig: Take 1 tablet (75 mg total) by mouth 2 (two) times daily.    Dispense:  14 tablet    Refill:  0    Orders Placed This Encounter  Procedures   DG Wrist Complete Right   Apply Wrist brace    Recommend:  Follow-up Information     Carole Civil, MD.   Specialties: Orthopedic Surgery, Radiology Why: If worsening or failing to improve as anticipated. Contact information: [address] [place] [address] [phone]                 Reviewed expectations re: course of current medical issues. Questions answered. Outlined signs and symptoms indicating need for more acute intervention. Patient verbalized understanding. After Visit Summary given.  SUBJECTIVE: History from: patient. Suzanne Baker is a 50 y.o. female who reports persistent pain of R wrist; today; fall off of stool; hit wrist; painful since. Mild swelling. No extremity sensation changes or weakness. No tx PTA.   Past Surgical History:  Procedure Laterality Date   TONSILLECTOMY        OBJECTIVE:  Vitals:   11/13/20 1649  BP: 133/75  Pulse: 88  Resp: 18  Temp: 98.6 F (37 C)  TempSrc: Oral  SpO2: 92%    General appearance: alert; no distress HEENT: [place]; AT Neck: supple with FROM Resp: unlabored respirations Extremities: RUE: warm with well perfused appearance; poorly localized moderate tenderness over right wrist; without gross deformities; swelling: minimal; bruising: none; wrist ROM: limited by reported pain CV: brisk extremity capillary refill of RUE; 2+ radial pulse of RUE. Skin: warm and dry; no visible rashes Neurologic: gait normal; normal  sensation and strength of RUE Psychological: alert and cooperative; normal mood and affect  Imaging: DG Wrist Complete Right  Result Date: 11/13/2020 CLINICAL DATA:  Fall EXAM: RIGHT WRIST - COMPLETE 3+ VIEW COMPARISON:  None. FINDINGS: There is no evidence of fracture or dislocation. There is no evidence of arthropathy or other focal bone abnormality. Soft tissues are unremarkable. IMPRESSION: Negative. Electronically Signed   By: Donavan Foil M.D.   On: 11/13/2020 17:14      No Known Allergies  Past Medical History:  Diagnosis Date   Scoliosis    Social History   Socioeconomic History   Marital status: Divorced    Spouse name: Not on file   Number of children: Not on file   Years of education: Not on file   Highest education level: Not on file  Occupational History   Not on file  Tobacco Use   Smoking status: Every Day    Packs/day: 1.00    Types: Cigarettes   Smokeless tobacco: Never  Substance and Sexual Activity   Alcohol use: No   Drug use: No   Sexual activity: Not on file  Other Topics Concern   Not on file  Social History Narrative   Not on file   Social Determinants of Health   Financial Resource Strain: Not on file  Food Insecurity: Not on file  Transportation Needs: Not on file  Physical Activity: Not on file  Stress: Not on file  Social Connections: Not on file  History reviewed. No pertinent family history. Past Surgical History:  Procedure Laterality Date   TONSILLECTOMY         Suzanne Kick, MD 11/14/20 1056

## 2022-01-29 IMAGING — MG DIGITAL SCREENING BILAT W/ TOMO W/ CAD
8 series · 9 of 24 positions shown · non-contrast
Comparison: Previous exam(s).

CLINICAL DATA: Screening.

EXAM:
DIGITAL SCREENING BILATERAL MAMMOGRAM WITH TOMO AND CAD

[R MLO synth-2D]
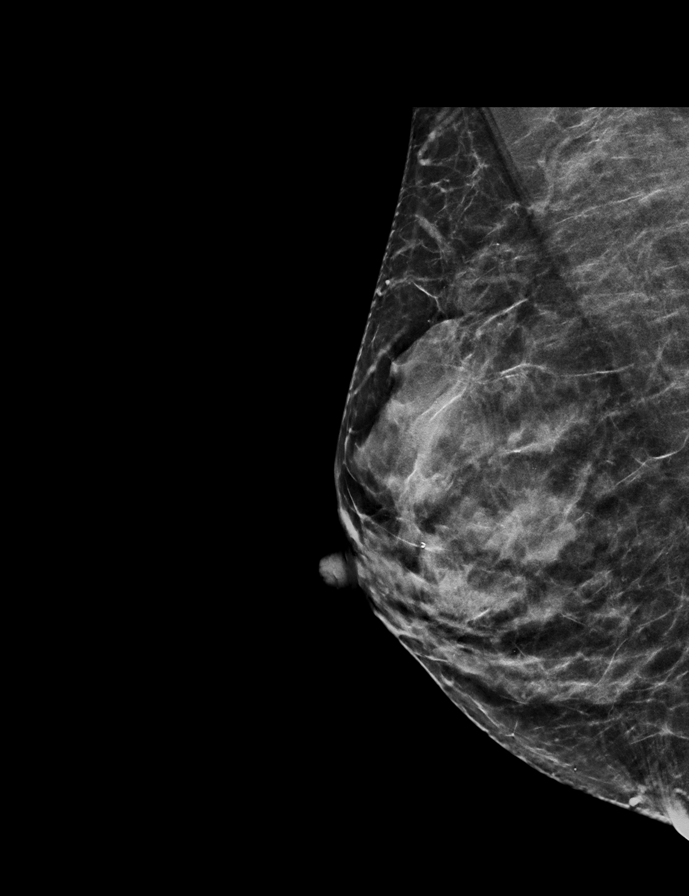

[L MLO synth-2D]
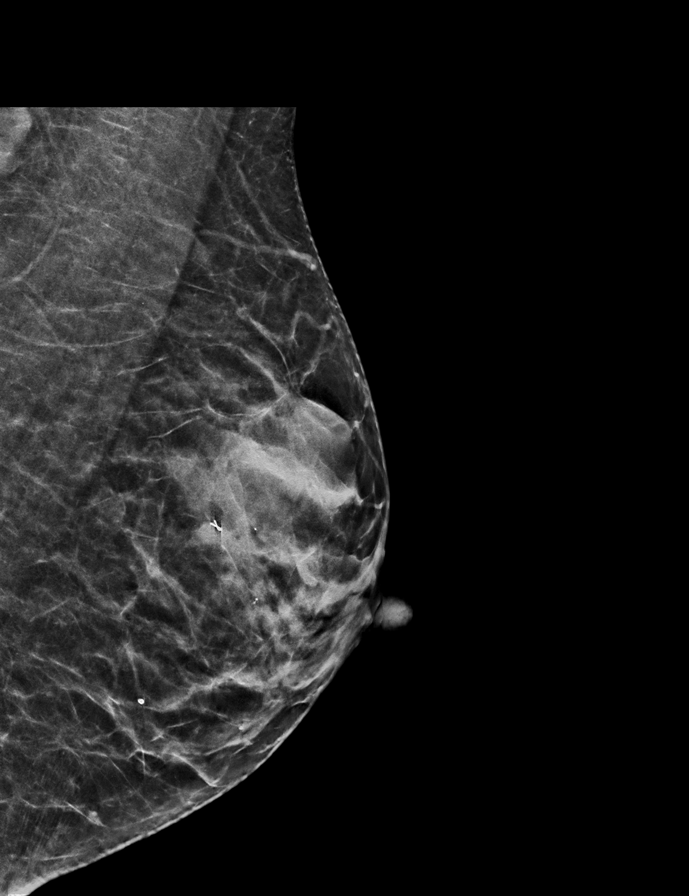

[R CC synth-2D]
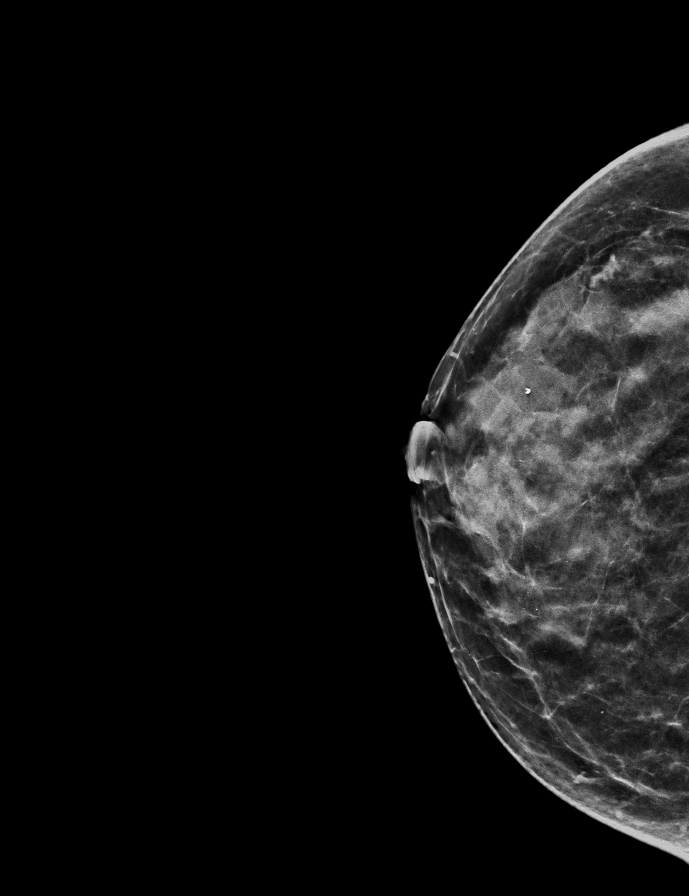

[L CC synth-2D]
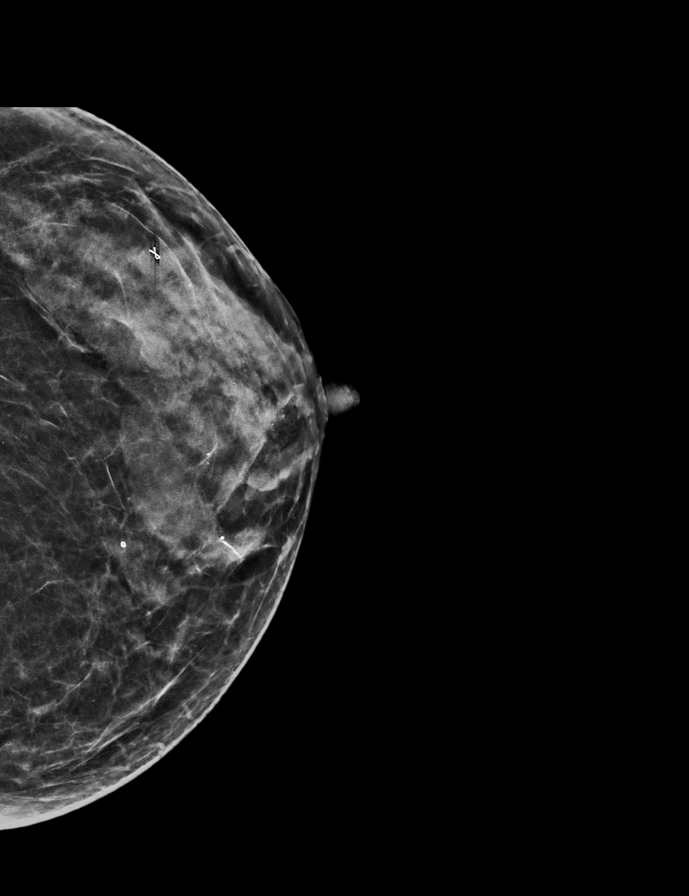

[L MLO tomo · 2 of 57 frames shown]
[frame 19/57]
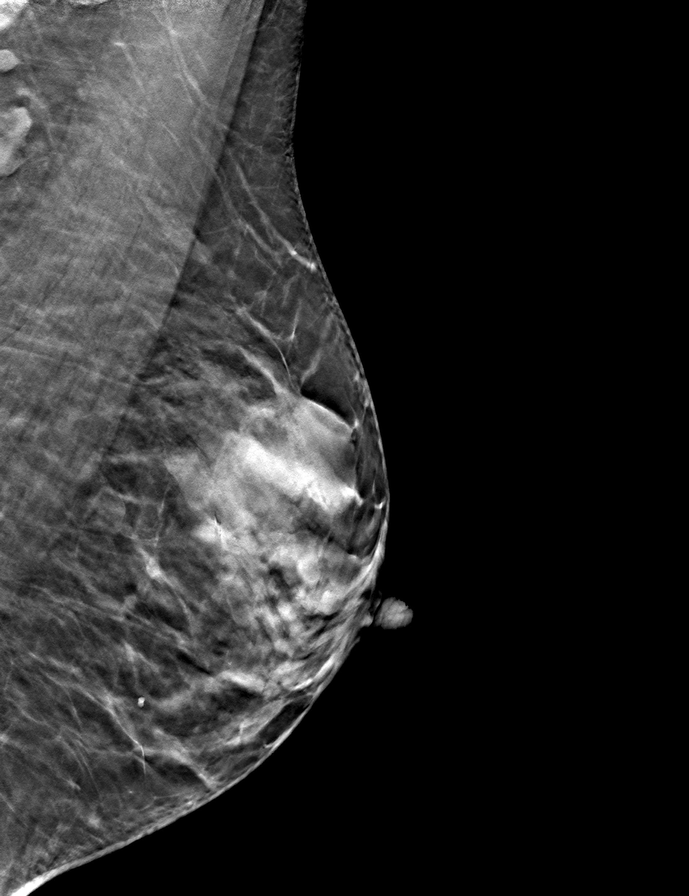
[frame 29/57]
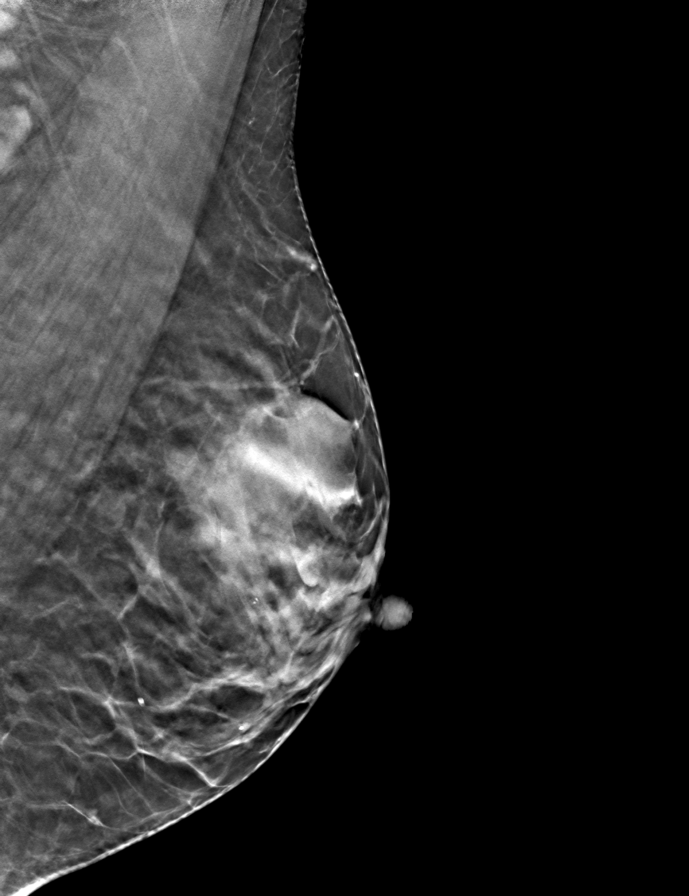

[R CC tomo · tomo slice 29/56.0]
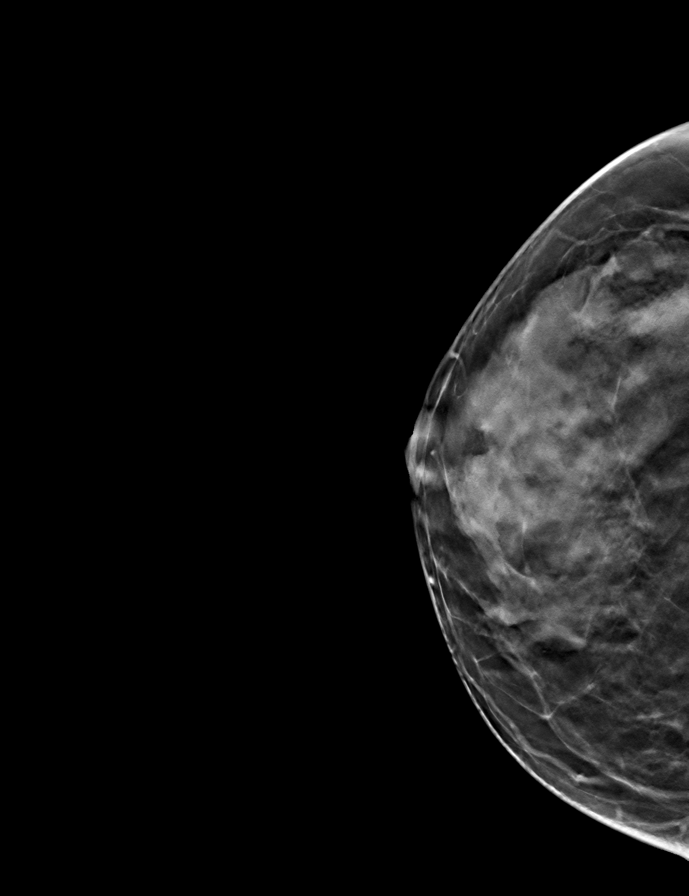

[R MLO tomo · tomo slice 29/58.0]
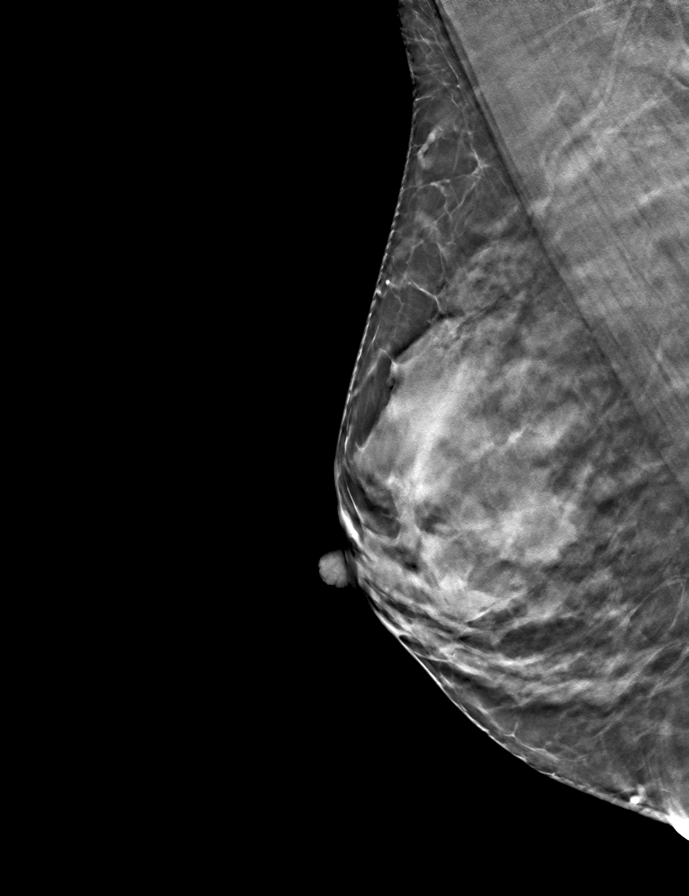

[L CC tomo · tomo slice 28/55.0]
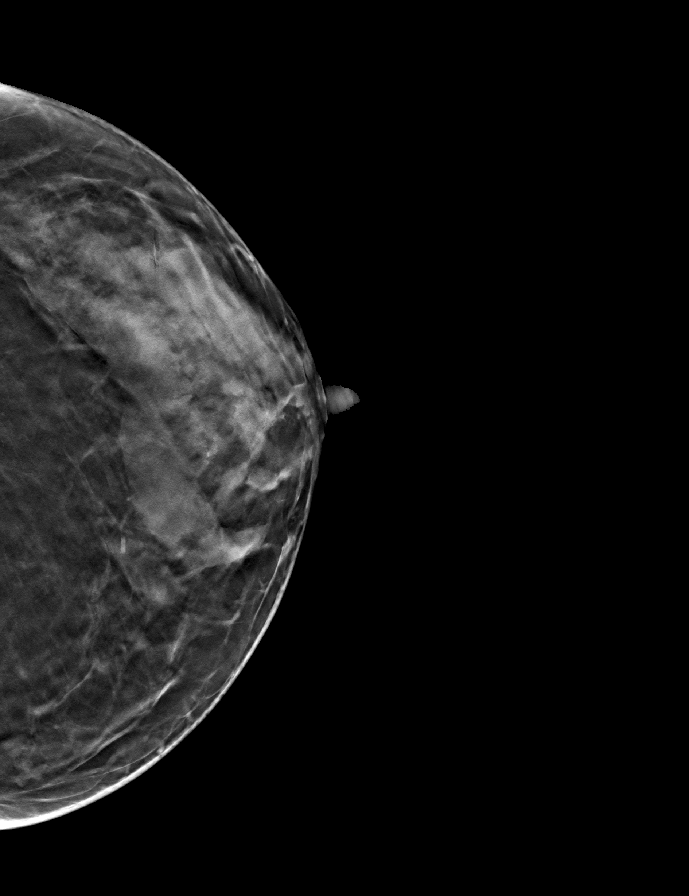

[9 of 24 positions shown; findings below may reference images not displayed]

ACR Breast Density Category c: The breast tissue is heterogeneously
dense, which may obscure small masses.
FINDINGS: There are no findings suspicious for malignancy. Images were
processed with CAD.
IMPRESSION: No mammographic evidence of malignancy. A result letter of this
screening mammogram will be mailed directly to the patient.

RECOMMENDATION:
Screening mammogram in one year. (Code:[id number]-LHB)

BI-RADS CATEGORY  1: Negative.

## 2023-05-28 ENCOUNTER — Inpatient Hospital Stay (HOSPITAL_COMMUNITY)
Admission: EM | Admit: 2023-05-28 | Discharge: 2023-05-30 | DRG: 522 | Disposition: A | Discharge status: Home / Self Care | Payer: Self-pay | Attending: Orthopedic Surgery | Admitting: Orthopedic Surgery

## 2023-05-28 ENCOUNTER — Encounter (HOSPITAL_COMMUNITY): Payer: Self-pay

## 2023-05-28 ENCOUNTER — Other Ambulatory Visit: Payer: Self-pay

## 2023-05-28 ENCOUNTER — Emergency Department (HOSPITAL_COMMUNITY): Payer: Self-pay

## 2023-05-28 DIAGNOSIS — W19XXXA Unspecified fall, initial encounter: Principal | ICD-10-CM

## 2023-05-28 DIAGNOSIS — E876 Hypokalemia: Secondary | ICD-10-CM | POA: Diagnosis present

## 2023-05-28 DIAGNOSIS — Z9089 Acquired absence of other organs: Secondary | ICD-10-CM

## 2023-05-28 DIAGNOSIS — F17210 Nicotine dependence, cigarettes, uncomplicated: Secondary | ICD-10-CM | POA: Diagnosis present

## 2023-05-28 DIAGNOSIS — M549 Dorsalgia, unspecified: Secondary | ICD-10-CM | POA: Diagnosis present

## 2023-05-28 DIAGNOSIS — W1830XA Fall on same level, unspecified, initial encounter: Secondary | ICD-10-CM | POA: Diagnosis present

## 2023-05-28 DIAGNOSIS — S72001A Fracture of unspecified part of neck of right femur, initial encounter for closed fracture: Principal | ICD-10-CM | POA: Diagnosis present

## 2023-05-28 DIAGNOSIS — Y990 Civilian activity done for income or pay: Secondary | ICD-10-CM

## 2023-05-28 DIAGNOSIS — K219 Gastro-esophageal reflux disease without esophagitis: Secondary | ICD-10-CM | POA: Diagnosis present

## 2023-05-28 DIAGNOSIS — Y9301 Activity, walking, marching and hiking: Secondary | ICD-10-CM | POA: Diagnosis present

## 2023-05-28 DIAGNOSIS — M419 Scoliosis, unspecified: Secondary | ICD-10-CM | POA: Diagnosis present

## 2023-05-28 DIAGNOSIS — J449 Chronic obstructive pulmonary disease, unspecified: Secondary | ICD-10-CM | POA: Diagnosis present

## 2023-05-28 HISTORY — DX: Gastro-esophageal reflux disease without esophagitis: K21.9

## 2023-05-28 LAB — PROTIME-INR
INR: 1 (ref 0.8–1.2)
Prothrombin Time: 13.5 s (ref 11.4–15.2)

## 2023-05-28 LAB — I-STAT CHEM 8, ED
BUN: 6 mg/dL (ref 6–20)
Calcium, Ion: 1.06 mmol/L — ABNORMAL LOW (ref 1.15–1.40)
Chloride: 98 mmol/L (ref 98–111)
Creatinine, Ser: 0.9 mg/dL (ref 0.44–1.00)
Glucose, Bld: 125 mg/dL — ABNORMAL HIGH (ref 70–99)
HCT: 41 % (ref 36.0–46.0)
Hemoglobin: 13.9 g/dL (ref 12.0–15.0)
Potassium: 3.3 mmol/L — ABNORMAL LOW (ref 3.5–5.1)
Sodium: 137 mmol/L (ref 135–145)
TCO2: 27 mmol/L (ref 22–32)

## 2023-05-28 LAB — CBC
HCT: 38.8 % (ref 36.0–46.0)
Hemoglobin: 13.1 g/dL (ref 12.0–15.0)
MCH: 32.2 pg (ref 26.0–34.0)
MCHC: 33.8 g/dL (ref 30.0–36.0)
MCV: 95.3 fL (ref 80.0–100.0)
Platelets: 252 10*3/uL (ref 150–400)
RBC: 4.07 MIL/uL (ref 3.87–5.11)
RDW: 12.6 % (ref 11.5–15.5)
WBC: 14.3 10*3/uL — ABNORMAL HIGH (ref 4.0–10.5)
nRBC: 0 % (ref 0.0–0.2)

## 2023-05-28 LAB — I-STAT CG4 LACTIC ACID, ED: Lactic Acid, Venous: 1.8 mmol/L (ref 0.5–1.9)

## 2023-05-28 LAB — SAMPLE TO BLOOD BANK

## 2023-05-28 MED ORDER — ONDANSETRON HCL 4 MG/2ML IJ SOLN
4.0000 mg | Freq: Once | INTRAMUSCULAR | Status: AC
  Administered 2023-05-28: 4 mg via INTRAVENOUS
  Filled 2023-05-28: qty 2

## 2023-05-28 MED ORDER — FENTANYL CITRATE PF 50 MCG/ML IJ SOSY
50.0000 ug | PREFILLED_SYRINGE | Freq: Once | INTRAMUSCULAR | Status: AC
  Administered 2023-05-28: 50 ug via INTRAVENOUS
  Filled 2023-05-28: qty 1

## 2023-05-28 MED ORDER — LACTATED RINGERS IV SOLN: INTRAVENOUS | Status: AC

## 2023-05-28 NOTE — Progress Notes (Signed)
 Chaplain responds to Level 2 fall and provides pastoral care to pt, who is tearful and says she had a really nice day until her accident. She says her brother followed the ambulance, and chaplain finds him and pt's daughter in waiting room and escorts them to pt's room. They deny further spiritual care needs.

## 2023-05-28 NOTE — ED Provider Notes (Signed)
 I saw and evaluated the patient, reviewed the resident's note and I agree with the findings and plan.      53 year old female here after mechanical fall just prior to arrival.  Patient fell onto her right hip.  No LOC.  Unable to ambulate.  Complains of sharp pain to her right hip.  Hip is shortened and rotated on physical exam.  Concern for possible fracture.  X-rays pending at this time   Lind Repine, MD 05/28/23 2304

## 2023-05-28 NOTE — ED Notes (Signed)
 Pt care taken, given pain meds and nausea medication.

## 2023-05-28 NOTE — ED Provider Notes (Signed)
  EMERGENCY DEPARTMENT AT [facility] Provider Note  History  Chief Complaint:  Fall   Fall Pertinent negatives include no chest pain, no abdominal pain and no headaches.  Trauma Mechanism of injury: Fall Injury location: pelvis Injury location detail: R hip Incident location: at work Arrived directly from scene: yes   Fall:      Fall occurred: walking      Impact surface: hard floor      Point of impact: R hip.  EMS/PTA data:      Loss of consciousness: no  Current symptoms:      Pain scale: 9/10      Pain timing: constant      Associated symptoms:            Denies abdominal pain, back pain, chest pain, difficulty breathing, headache, loss of consciousness and vomiting.   Relevant PMH:      Medical risk factors:            No asthma.       Pharmacological risk factors:            No anticoagulation therapy.       Tetanus status: unknown    Suzanne Baker is a 53 y.o. female with no medical history who presents to the emergency department after a fall.  Past Medical History:  Diagnosis Date   Scoliosis     Past Surgical History:  Procedure Laterality Date   TONSILLECTOMY      History reviewed. No pertinent family history.  Social History   Tobacco Use   Smoking status: Every Day    Current packs/day: 1.00    Types: Cigarettes   Smokeless tobacco: Never  Substance Use Topics   Alcohol use: No   Drug use: No    Review of Systems  Review of Systems  Cardiovascular:  Negative for chest pain.  Gastrointestinal:  Negative for abdominal pain and vomiting.  Musculoskeletal:  Negative for back pain.  Neurological:  Negative for loss of consciousness and headaches.     Reviewed and documented in HPI if pertinent.  Physical Exam   Today's Vitals   05/28/23 2303 05/28/23 2314 05/28/23 2316 05/28/23 2318  BP:      Pulse:      Resp:      Temp:   (!) 97.4 F (36.3 C)   TempSrc:   Temporal   SpO2: 97%     Weight:  72.6 kg     Height:  5\' 6"  (1.676 m)    PainSc:    10-Worst pain ever   Body mass index is 25.82 kg/m.;s    Physical Exam  Constitutional Nursing notes reviewed Vital signs reviewed  Head No obvious trauma No skull depressions or lacerations  ENT PERRL No conjunctival hemorrhage No periorbital ecchymoses, [place] Eyes, or Battle Sign bilaterally Ears atraumatic No nasal septal deviation or hematoma Mouth and tongue atraumatic Trachea midline.  Neck No C spine stepoffs, deformities, or tenderness  Chest Clavicles atraumatic Clavicles stable to anterior compression without crepitus Chest wall with symmetric expansion Chest wall stable to anterior and lateral compression without crepitus  Respiratory Effort normal CTAB No respiratory distress  CV Normal rate DP and radial pulses 2+ and equal bilaterally  Abdomen Soft Non-tender Non-distended No peritonitis No abrasions/contusions  GU Atraumatic No gross blood  MSK R hip pain RLE shortened and external rotation ROM appropriate Pelvis stable to lateral compression  Back T spine non-tender L spine non-tender No  step offs or deformities   Skin Warm Dry  Neuro Awake and alert Moving all extremities GCS 15     Procedures   Procedures  ED Course - Medical Decision Making  Brief Overview Suzanne Baker is a 53 y.o. female who presents as per above.  I have reviewed the nursing documentation for past medical history, family history, and social history and agree.  I have reviewed the patient's vital signs. There are no abnormalities.  Initial Differential Diagnoses: I am primarily concerned for fracture, dislocation, MSK sprain.  Therapies: These medications and interventions were provided for the patient while in the ED.  Medications  lactated ringers infusion ( Intravenous New Bag/Given 05/28/23 2332)  HYDROmorphone (DILAUDID) injection 0.5 mg (has no administration in time range)  fentaNYL  (SUBLIMAZE) injection 50 mcg (50 mcg Intravenous Given 05/28/23 2318)  ondansetron  (ZOFRAN ) injection 4 mg (4 mg Intravenous Given 05/28/23 2338)    Testing Results: On my interpretation labs are significant for : Cr 1.06 Mild leukocytosis, likely reactive in setting of trauma  On my interpretation imaging is significant for: Right femoral neck fracture  See the EMR for full details regarding lab and imaging results.  Medical Decision Making Suzanne Baker is a 53 y.o. female with no significant PMHx  who presented to the ED by EMS as an activated Level 2 trauma for R hip pain after fall.  Prior to arrival of the patient, the room was prepared with the following: code cart to bedside, glidescope, suction, BVM.   Pertinent physical exam findings include right lower extremity pain upon palpation of the hip, 2+ DP and PT pulses, right lower extremity is shortened and externally rotated. Portable XRs performed at the bedside.   Patient does not take blood thinners.  She has no headache.  She has no head trauma.  States that she did not strike her head.  She is GCS 15.  She has no neck tenderness.  She has no thoracic or lumbar midline tenderness.  She has no left lower extremity tenderness.  Her chest wall is nontender.  Her abdomen is nontender.  At this time I do not feel that further labs and imaging studies are indicated.  Patient does have a right hip fracture on x-ray.  I have consulted orthopedics.  Patient was signed out to oncoming provider.  Please see their note for further details.  We are awaiting final recommendations orthopedics.  Patient will require admission.  Amount and/or Complexity of Data Reviewed Labs: ordered. Radiology: ordered.  Risk Prescription drug management.     ### All radiography studies, electrocardiograms, and laboratory data were personally reviewed by me and incorporated into my medical decision making. Impression   1. Fall, initial encounter   2.  Closed fracture of neck of right femur, initial encounter [facility])      Note: Dragon medical dictation software was used in the creation of this note.     Arminda Landmark, MD 05/29/23 9604    Lind Repine, MD 05/31/23 [phone]

## 2023-05-29 ENCOUNTER — Inpatient Hospital Stay (HOSPITAL_COMMUNITY): Payer: Self-pay | Admitting: Registered Nurse

## 2023-05-29 ENCOUNTER — Encounter (HOSPITAL_COMMUNITY)
Admission: EM | Disposition: A | Discharge status: Home / Self Care | Payer: Self-pay | Source: Home / Self Care | Attending: Orthopedic Surgery

## 2023-05-29 ENCOUNTER — Inpatient Hospital Stay (HOSPITAL_COMMUNITY): Payer: Self-pay

## 2023-05-29 ENCOUNTER — Other Ambulatory Visit: Payer: Self-pay

## 2023-05-29 ENCOUNTER — Encounter (HOSPITAL_COMMUNITY): Payer: Self-pay | Admitting: Orthopedic Surgery

## 2023-05-29 DIAGNOSIS — S72001A Fracture of unspecified part of neck of right femur, initial encounter for closed fracture: Secondary | ICD-10-CM | POA: Diagnosis present

## 2023-05-29 DIAGNOSIS — M419 Scoliosis, unspecified: Secondary | ICD-10-CM | POA: Diagnosis present

## 2023-05-29 DIAGNOSIS — Y990 Civilian activity done for income or pay: Secondary | ICD-10-CM | POA: Diagnosis not present

## 2023-05-29 DIAGNOSIS — Y9301 Activity, walking, marching and hiking: Secondary | ICD-10-CM | POA: Diagnosis present

## 2023-05-29 DIAGNOSIS — W1830XA Fall on same level, unspecified, initial encounter: Secondary | ICD-10-CM | POA: Diagnosis present

## 2023-05-29 DIAGNOSIS — K219 Gastro-esophageal reflux disease without esophagitis: Secondary | ICD-10-CM | POA: Diagnosis present

## 2023-05-29 DIAGNOSIS — M549 Dorsalgia, unspecified: Secondary | ICD-10-CM | POA: Diagnosis present

## 2023-05-29 DIAGNOSIS — J449 Chronic obstructive pulmonary disease, unspecified: Secondary | ICD-10-CM | POA: Diagnosis present

## 2023-05-29 DIAGNOSIS — F17210 Nicotine dependence, cigarettes, uncomplicated: Secondary | ICD-10-CM | POA: Diagnosis present

## 2023-05-29 DIAGNOSIS — E876 Hypokalemia: Secondary | ICD-10-CM | POA: Diagnosis present

## 2023-05-29 DIAGNOSIS — Z9089 Acquired absence of other organs: Secondary | ICD-10-CM | POA: Diagnosis not present

## 2023-05-29 HISTORY — PX: TOTAL HIP ARTHROPLASTY: SHX124

## 2023-05-29 LAB — ABO/RH: ABO/RH(D): A POS

## 2023-05-29 LAB — TYPE AND SCREEN
ABO/RH(D): A POS
Antibody Screen: NEGATIVE

## 2023-05-29 LAB — COMPREHENSIVE METABOLIC PANEL WITH GFR
ALT: 13 U/L (ref 0–44)
AST: 18 U/L (ref 15–41)
Albumin: 3.9 g/dL (ref 3.5–5.0)
Alkaline Phosphatase: 80 U/L (ref 38–126)
Anion gap: 12 (ref 5–15)
BUN: 7 mg/dL (ref 6–20)
CO2: 27 mmol/L (ref 22–32)
Calcium: 8.9 mg/dL (ref 8.9–10.3)
Chloride: 100 mmol/L (ref 98–111)
Creatinine, Ser: 0.94 mg/dL (ref 0.44–1.00)
GFR, Estimated: >60 mL/min (ref >60)
Glucose, Bld: 128 mg/dL — ABNORMAL HIGH (ref 70–99)
Potassium: 3.4 mmol/L — ABNORMAL LOW (ref 3.5–5.1)
Sodium: 139 mmol/L (ref 135–145)
Total Bilirubin: 0.5 mg/dL (ref 0.0–1.2)
Total Protein: 6.9 g/dL (ref 6.5–8.1)

## 2023-05-29 LAB — CBC
HCT: 36.3 % (ref 36.0–46.0)
Hemoglobin: 12.3 g/dL (ref 12.0–15.0)
MCH: 31.9 pg (ref 26.0–34.0)
MCHC: 33.9 g/dL (ref 30.0–36.0)
MCV: 94.3 fL (ref 80.0–100.0)
Platelets: 242 10*3/uL (ref 150–400)
RBC: 3.85 MIL/uL — ABNORMAL LOW (ref 3.87–5.11)
RDW: 12.7 % (ref 11.5–15.5)
WBC: 15 10*3/uL — ABNORMAL HIGH (ref 4.0–10.5)
nRBC: 0 % (ref 0.0–0.2)

## 2023-05-29 LAB — URINALYSIS, ROUTINE W REFLEX MICROSCOPIC
Bilirubin Urine: NEGATIVE
Glucose, UA: NEGATIVE mg/dL
Hgb urine dipstick: NEGATIVE
Ketones, ur: NEGATIVE mg/dL
Leukocytes,Ua: NEGATIVE
Nitrite: NEGATIVE
Protein, ur: NEGATIVE mg/dL
Specific Gravity, Urine: 1.003 — ABNORMAL LOW (ref 1.005–1.030)
pH: 7 (ref 5.0–8.0)

## 2023-05-29 LAB — BASIC METABOLIC PANEL WITH GFR
Anion gap: 8 (ref 5–15)
BUN: 5 mg/dL — ABNORMAL LOW (ref 6–20)
CO2: 28 mmol/L (ref 22–32)
Calcium: 8.5 mg/dL — ABNORMAL LOW (ref 8.9–10.3)
Chloride: 102 mmol/L (ref 98–111)
Creatinine, Ser: 0.81 mg/dL (ref 0.44–1.00)
GFR, Estimated: >60 mL/min (ref >60)
Glucose, Bld: 125 mg/dL — ABNORMAL HIGH (ref 70–99)
Potassium: 3.1 mmol/L — ABNORMAL LOW (ref 3.5–5.1)
Sodium: 138 mmol/L (ref 135–145)

## 2023-05-29 LAB — MRSA NEXT GEN BY PCR, NASAL: MRSA by PCR Next Gen: NOT DETECTED

## 2023-05-29 LAB — ETHANOL: Alcohol, Ethyl (B): <15 mg/dL (ref <15)

## 2023-05-29 SURGERY — ARTHROPLASTY, HIP, TOTAL,POSTERIOR APPROACH
Anesthesia: General | Site: Hip | Laterality: Right

## 2023-05-29 MED ORDER — SODIUM CHLORIDE 0.9 % IV SOLN: INTRAVENOUS | Status: DC | PRN

## 2023-05-29 MED ORDER — SODIUM CHLORIDE 0.9 % IR SOLN
Status: DC | PRN
  Administered 2023-05-29: 1000 mL

## 2023-05-29 MED ORDER — OXYCODONE HCL 5 MG PO TABS
ORAL_TABLET | ORAL | Status: AC
Start: 2023-05-29 — End: 2023-05-29
  Administered 2023-05-29: 5 mg
  Filled 2023-05-29: qty 1

## 2023-05-29 MED ORDER — PANTOPRAZOLE SODIUM 40 MG PO TBEC
40.0000 mg | DELAYED_RELEASE_TABLET | Freq: Every day | ORAL | Status: DC
  Administered 2023-05-29 – 2023-05-30 (×2): 40 mg via ORAL
  Filled 2023-05-29 (×2): qty 1

## 2023-05-29 MED ORDER — LACTATED RINGERS IV SOLN: INTRAVENOUS | Status: DC

## 2023-05-29 MED ORDER — PHENYLEPHRINE 80 MCG/ML (10ML) SYRINGE FOR IV PUSH (FOR BLOOD PRESSURE SUPPORT)
PREFILLED_SYRINGE | INTRAVENOUS | Status: DC | PRN
  Administered 2023-05-29: 80 ug via INTRAVENOUS

## 2023-05-29 MED ORDER — CHLORHEXIDINE GLUCONATE 0.12 % MT SOLN
OROMUCOSAL | Status: AC
  Administered 2023-05-29: 15 mL via OROMUCOSAL
  Filled 2023-05-29: qty 15

## 2023-05-29 MED ORDER — POLYETHYLENE GLYCOL 3350 17 G PO PACK: 17.0000 g | PACK | Freq: Every day | ORAL | Status: DC | PRN

## 2023-05-29 MED ORDER — OXYCODONE HCL 5 MG PO TABS
5.0000 mg | ORAL_TABLET | ORAL | 0 refills | Status: DC | PRN
Start: 2023-05-29 — End: 2023-06-05

## 2023-05-29 MED ORDER — CEFAZOLIN SODIUM-DEXTROSE 2-4 GM/100ML-% IV SOLN
2.0000 g | INTRAVENOUS | Status: AC
  Administered 2023-05-29: 2 g via INTRAVENOUS

## 2023-05-29 MED ORDER — METHOCARBAMOL 500 MG PO TABS: 500.0000 mg | ORAL_TABLET | Freq: Three times a day (TID) | ORAL | 0 refills | Status: AC | PRN

## 2023-05-29 MED ORDER — SODIUM CHLORIDE (PF) 0.9 % IJ SOLN
INTRAMUSCULAR | Status: DC | PRN
  Administered 2023-05-29: 30 mL

## 2023-05-29 MED ORDER — DOCUSATE SODIUM 100 MG PO CAPS
100.0000 mg | ORAL_CAPSULE | Freq: Two times a day (BID) | ORAL | Status: DC
  Administered 2023-05-29 – 2023-05-30 (×3): 100 mg via ORAL
  Filled 2023-05-29 (×3): qty 1

## 2023-05-29 MED ORDER — HYDROMORPHONE HCL 1 MG/ML IJ SOLN
INTRAMUSCULAR | Status: AC
  Filled 2023-05-29: qty 0.5

## 2023-05-29 MED ORDER — DEXAMETHASONE SODIUM PHOSPHATE 10 MG/ML IJ SOLN
INTRAMUSCULAR | Status: DC | PRN
  Administered 2023-05-29: 10 mg via INTRAVENOUS

## 2023-05-29 MED ORDER — OXYCODONE HCL 5 MG PO TABS
5.0000 mg | ORAL_TABLET | ORAL | Status: DC | PRN
  Administered 2023-05-29 – 2023-05-30 (×4): 5 mg via ORAL
  Filled 2023-05-29 (×2): qty 1
  Filled 2023-05-29: qty 2
  Filled 2023-05-29 (×2): qty 1

## 2023-05-29 MED ORDER — MIDAZOLAM HCL 2 MG/2ML IJ SOLN
INTRAMUSCULAR | Status: DC | PRN
  Administered 2023-05-29: 2 mg via INTRAVENOUS

## 2023-05-29 MED ORDER — ALBUTEROL SULFATE (2.5 MG/3ML) 0.083% IN NEBU
INHALATION_SOLUTION | RESPIRATORY_TRACT | Status: AC
  Filled 2023-05-29: qty 3

## 2023-05-29 MED ORDER — BUPIVACAINE-EPINEPHRINE (PF) 0.25% -1:200000 IJ SOLN
INTRAMUSCULAR | Status: DC | PRN
  Administered 2023-05-29: 30 mL

## 2023-05-29 MED ORDER — FENTANYL CITRATE (PF) 250 MCG/5ML IJ SOLN
INTRAMUSCULAR | Status: DC | PRN
Start: 2023-05-29 — End: 2023-05-29
  Administered 2023-05-29 (×5): 50 ug via INTRAVENOUS

## 2023-05-29 MED ORDER — ORAL CARE MOUTH RINSE: 15.0000 mL | Freq: Once | OROMUCOSAL | Status: AC

## 2023-05-29 MED ORDER — WATER FOR IRRIGATION, STERILE IR SOLN
Status: DC | PRN
Start: 2023-05-29 — End: 2023-05-29
  Administered 2023-05-29: 1000 mL

## 2023-05-29 MED ORDER — HYDROMORPHONE HCL 1 MG/ML IJ SOLN: 0.2500 mg | INTRAMUSCULAR | Status: DC | PRN

## 2023-05-29 MED ORDER — TRANEXAMIC ACID-NACL 1000-0.7 MG/100ML-% IV SOLN
INTRAVENOUS | Status: AC
Start: 2023-05-29 — End: 2023-05-29
  Filled 2023-05-29: qty 100

## 2023-05-29 MED ORDER — MEPERIDINE HCL 25 MG/ML IJ SOLN: 6.2500 mg | INTRAMUSCULAR | Status: DC | PRN

## 2023-05-29 MED ORDER — ONDANSETRON 4 MG PO TBDP
4.0000 mg | ORAL_TABLET | Freq: Three times a day (TID) | ORAL | Status: DC | PRN
Start: 2023-05-29 — End: 2023-05-29

## 2023-05-29 MED ORDER — KETOROLAC TROMETHAMINE 15 MG/ML IJ SOLN
INTRAMUSCULAR | Status: DC | PRN
  Administered 2023-05-29: 15 mg via INTRAVENOUS

## 2023-05-29 MED ORDER — HYDROMORPHONE HCL 1 MG/ML IJ SOLN
0.5000 mg | Freq: Once | INTRAMUSCULAR | Status: AC
  Administered 2023-05-29: 0.5 mg via INTRAVENOUS
  Filled 2023-05-29: qty 1

## 2023-05-29 MED ORDER — CELECOXIB 100 MG PO CAPS: 100.0000 mg | ORAL_CAPSULE | Freq: Two times a day (BID) | ORAL | 0 refills | Status: AC

## 2023-05-29 MED ORDER — ALBUTEROL SULFATE (2.5 MG/3ML) 0.083% IN NEBU
2.5000 mg | INHALATION_SOLUTION | Freq: Once | RESPIRATORY_TRACT | Status: AC
  Administered 2023-05-29: 2.5 mg via RESPIRATORY_TRACT

## 2023-05-29 MED ORDER — ASPIRIN 81 MG PO CHEW
81.0000 mg | CHEWABLE_TABLET | Freq: Two times a day (BID) | ORAL | Status: DC
  Administered 2023-05-29 – 2023-05-30 (×2): 81 mg via ORAL
  Filled 2023-05-29 (×2): qty 1

## 2023-05-29 MED ORDER — MIDAZOLAM HCL 2 MG/2ML IJ SOLN: 0.5000 mg | Freq: Once | INTRAMUSCULAR | Status: DC | PRN

## 2023-05-29 MED ORDER — PROPOFOL 10 MG/ML IV BOLUS
INTRAVENOUS | Status: DC | PRN
  Administered 2023-05-29: 150 mg via INTRAVENOUS

## 2023-05-29 MED ORDER — METHOCARBAMOL 500 MG PO TABS
500.0000 mg | ORAL_TABLET | Freq: Four times a day (QID) | ORAL | Status: DC | PRN
  Administered 2023-05-29 – 2023-05-30 (×2): 500 mg via ORAL
  Filled 2023-05-29 (×2): qty 1

## 2023-05-29 MED ORDER — ACETAMINOPHEN 10 MG/ML IV SOLN
INTRAVENOUS | Status: AC
  Filled 2023-05-29: qty 100

## 2023-05-29 MED ORDER — BUPIVACAINE-EPINEPHRINE (PF) 0.25% -1:200000 IJ SOLN
INTRAMUSCULAR | Status: AC
  Filled 2023-05-29: qty 30

## 2023-05-29 MED ORDER — SENNA 8.6 MG PO TABS
1.0000 | ORAL_TABLET | Freq: Two times a day (BID) | ORAL | Status: DC
  Administered 2023-05-29 – 2023-05-30 (×3): 8.6 mg via ORAL
  Filled 2023-05-29 (×3): qty 1

## 2023-05-29 MED ORDER — ROCURONIUM BROMIDE 10 MG/ML (PF) SYRINGE
PREFILLED_SYRINGE | INTRAVENOUS | Status: DC | PRN
  Administered 2023-05-29: 10 mg via INTRAVENOUS
  Administered 2023-05-29: 60 mg via INTRAVENOUS

## 2023-05-29 MED ORDER — HYDROMORPHONE HCL 1 MG/ML IJ SOLN: 0.5000 mg | INTRAMUSCULAR | Status: DC | PRN

## 2023-05-29 MED ORDER — ZOLPIDEM TARTRATE 5 MG PO TABS: 5.0000 mg | ORAL_TABLET | Freq: Every evening | ORAL | Status: DC | PRN

## 2023-05-29 MED ORDER — HYDROMORPHONE HCL 1 MG/ML IJ SOLN
0.5000 mg | INTRAMUSCULAR | Status: DC | PRN
  Administered 2023-05-29 (×3): 0.5 mg via INTRAVENOUS
  Filled 2023-05-29 (×3): qty 0.5

## 2023-05-29 MED ORDER — POLYETHYLENE GLYCOL 3350 17 G PO PACK: 17.0000 g | PACK | Freq: Every day | ORAL | 0 refills | Status: AC

## 2023-05-29 MED ORDER — OXYCODONE HCL 5 MG PO TABS
5.0000 mg | ORAL_TABLET | Freq: Once | ORAL | Status: AC | PRN
  Administered 2023-05-29: 5 mg via ORAL

## 2023-05-29 MED ORDER — MENTHOL 3 MG MT LOZG: 1.0000 | LOZENGE | OROMUCOSAL | Status: DC | PRN

## 2023-05-29 MED ORDER — LIDOCAINE 2% (20 MG/ML) 5 ML SYRINGE
INTRAMUSCULAR | Status: DC | PRN
  Administered 2023-05-29: 20 mg via INTRAVENOUS

## 2023-05-29 MED ORDER — TRANEXAMIC ACID-NACL 1000-0.7 MG/100ML-% IV SOLN
1000.0000 mg | INTRAVENOUS | Status: AC
  Administered 2023-05-29: 1000 mg via INTRAVENOUS

## 2023-05-29 MED ORDER — DIPHENHYDRAMINE HCL 12.5 MG/5ML PO ELIX
12.5000 mg | ORAL_SOLUTION | ORAL | Status: DC | PRN
  Administered 2023-05-30: 25 mg via ORAL
  Filled 2023-05-29: qty 10

## 2023-05-29 MED ORDER — MIDAZOLAM HCL 2 MG/2ML IJ SOLN
INTRAMUSCULAR | Status: AC
  Filled 2023-05-29: qty 2

## 2023-05-29 MED ORDER — 0.9 % SODIUM CHLORIDE (POUR BTL) OPTIME
TOPICAL | Status: DC | PRN
  Administered 2023-05-29: 1000 mL

## 2023-05-29 MED ORDER — CHLORHEXIDINE GLUCONATE 0.12 % MT SOLN: 15.0000 mL | Freq: Once | OROMUCOSAL | Status: AC

## 2023-05-29 MED ORDER — BUPIVACAINE LIPOSOME 1.3 % IJ SUSP
INTRAMUSCULAR | Status: DC | PRN
  Administered 2023-05-29: 20 mL

## 2023-05-29 MED ORDER — CHLORHEXIDINE GLUCONATE 4 % EX SOLN
60.0000 mL | Freq: Once | CUTANEOUS | Status: AC
  Administered 2023-05-29: 4 via TOPICAL
  Filled 2023-05-29: qty 60

## 2023-05-29 MED ORDER — BUPIVACAINE LIPOSOME 1.3 % IJ SUSP
INTRAMUSCULAR | Status: AC
  Filled 2023-05-29: qty 20

## 2023-05-29 MED ORDER — FENTANYL CITRATE (PF) 250 MCG/5ML IJ SOLN
INTRAMUSCULAR | Status: AC
  Filled 2023-05-29: qty 5

## 2023-05-29 MED ORDER — SURGIRINSE WOUND IRRIGATION SYSTEM - OPTIME
TOPICAL | Status: DC | PRN
  Administered 2023-05-29: 450 mL

## 2023-05-29 MED ORDER — ONDANSETRON HCL 4 MG PO TABS: 4.0000 mg | ORAL_TABLET | Freq: Three times a day (TID) | ORAL | 0 refills | Status: AC | PRN

## 2023-05-29 MED ORDER — POVIDONE-IODINE 10 % EX SWAB
2.0000 | Freq: Once | CUTANEOUS | Status: AC
  Administered 2023-05-29: 2 via TOPICAL

## 2023-05-29 MED ORDER — PHENOL 1.4 % MT LIQD: 1.0000 | OROMUCOSAL | Status: DC | PRN

## 2023-05-29 MED ORDER — HYDROMORPHONE HCL 1 MG/ML IJ SOLN
INTRAMUSCULAR | Status: DC | PRN
  Administered 2023-05-29 (×2): .5 mg via INTRAVENOUS

## 2023-05-29 MED ORDER — OXYCODONE HCL 5 MG/5ML PO SOLN: 5.0000 mg | Freq: Once | ORAL | Status: AC | PRN

## 2023-05-29 MED ORDER — ONDANSETRON HCL 4 MG/2ML IJ SOLN
INTRAMUSCULAR | Status: DC | PRN
Start: 2023-05-29 — End: 2023-05-29
  Administered 2023-05-29: 4 mg via INTRAVENOUS

## 2023-05-29 MED ORDER — CEFAZOLIN SODIUM-DEXTROSE 2-4 GM/100ML-% IV SOLN
INTRAVENOUS | Status: AC
Start: 2023-05-29 — End: 2023-05-29
  Filled 2023-05-29: qty 100

## 2023-05-29 MED ORDER — ASPIRIN 81 MG PO TBEC
81.0000 mg | DELAYED_RELEASE_TABLET | Freq: Two times a day (BID) | ORAL | Status: AC
Start: 2023-05-30 — End: 2023-06-27

## 2023-05-29 MED ORDER — ACETAMINOPHEN 500 MG PO TABS: 1000.0000 mg | ORAL_TABLET | Freq: Three times a day (TID) | ORAL | Status: DC | PRN

## 2023-05-29 MED ORDER — ONDANSETRON HCL 4 MG/2ML IJ SOLN
4.0000 mg | Freq: Four times a day (QID) | INTRAMUSCULAR | Status: DC
  Administered 2023-05-29 – 2023-05-30 (×6): 4 mg via INTRAVENOUS
  Filled 2023-05-29 (×7): qty 2

## 2023-05-29 MED ORDER — NICOTINE 21 MG/24HR TD PT24
21.0000 mg | MEDICATED_PATCH | Freq: Every day | TRANSDERMAL | Status: DC
  Administered 2023-05-29 – 2023-05-30 (×2): 21 mg via TRANSDERMAL
  Filled 2023-05-29 (×2): qty 1

## 2023-05-29 MED ORDER — ACETAMINOPHEN 10 MG/ML IV SOLN
INTRAVENOUS | Status: DC | PRN
  Administered 2023-05-29: 1000 mg via INTRAVENOUS

## 2023-05-29 MED ORDER — SUGAMMADEX SODIUM 200 MG/2ML IV SOLN
INTRAVENOUS | Status: DC | PRN
  Administered 2023-05-29: 200 mg via INTRAVENOUS

## 2023-05-29 MED ORDER — METHOCARBAMOL 1000 MG/10ML IJ SOLN
500.0000 mg | Freq: Four times a day (QID) | INTRAMUSCULAR | Status: DC | PRN
  Filled 2023-05-29: qty 10

## 2023-05-29 SURGICAL SUPPLY — 49 items
BIT DRILL TRIDENT 4X25 SU (BIT) IMPLANT
BLADE SAGITTAL 25.0X1.27X90 (BLADE) ×1 IMPLANT
CHLORAPREP W/TINT 26 (MISCELLANEOUS) ×2 IMPLANT
COVER SURGICAL LIGHT HANDLE (MISCELLANEOUS) ×1 IMPLANT
DERMABOND ADVANCED .7 DNX12 (GAUZE/BANDAGES/DRESSINGS) IMPLANT
DRAPE HALF SHEET 40X57 (DRAPES) ×1 IMPLANT
DRAPE HIP W/POCKET STRL (MISCELLANEOUS) ×1 IMPLANT
DRAPE INCISE IOBAN 85X60 (DRAPES) ×1 IMPLANT
DRAPE POUCH INSTRU U-SHP 10X18 (DRAPES) ×1 IMPLANT
DRAPE U-SHAPE 47X51 STRL (DRAPES) ×2 IMPLANT
DRSG AQUACEL AG ADV 3.5X10 (GAUZE/BANDAGES/DRESSINGS) ×1 IMPLANT
ELECTRODE BLDE 4.0 EZ CLN MEGD (MISCELLANEOUS) ×1 IMPLANT
GLOVE BIO SURGEON STRL SZ 6.5 (GLOVE) IMPLANT
GLOVE BIOGEL PI IND STRL 6.5 (GLOVE) IMPLANT
GLOVE BIOGEL PI IND STRL 8 (GLOVE) ×1 IMPLANT
GLOVE SURG ORTHO 8.0 STRL STRW (GLOVE) ×2 IMPLANT
GOWN STRL REUS W/ TWL LRG LVL3 (GOWN DISPOSABLE) ×2 IMPLANT
GOWN STRL REUS W/ TWL XL LVL3 (GOWN DISPOSABLE) ×1 IMPLANT
HEAD CERAMIC FEMORAL 36MM (Head) IMPLANT
HOOD PEEL AWAY T7 (MISCELLANEOUS) ×3 IMPLANT
INSERT 0 DEGREE 36 (Miscellaneous) IMPLANT
KIT BASIN OR (CUSTOM PROCEDURE TRAY) ×1 IMPLANT
KIT TURNOVER KIT B (KITS) ×1 IMPLANT
MANIFOLD NEPTUNE II (INSTRUMENTS) ×1 IMPLANT
MARKER SKIN DUAL TIP RULER LAB (MISCELLANEOUS) ×1 IMPLANT
NDL 18GX1X1/2 (RX/OR ONLY) (NEEDLE) ×1 IMPLANT
NEEDLE 18GX1X1/2 (RX/OR ONLY) (NEEDLE) ×1 IMPLANT
NS IRRIG 1000ML POUR BTL (IV SOLUTION) ×1 IMPLANT
PACK TOTAL JOINT (CUSTOM PROCEDURE TRAY) ×1 IMPLANT
RETRIEVER SUT HEWSON (MISCELLANEOUS) ×1 IMPLANT
SCREW HEX LP 6.5X15 (Screw) IMPLANT
SCREW HEX LP 6.5X25 (Screw) IMPLANT
SET HNDPC FAN SPRY TIP SCT (DISPOSABLE) ×1 IMPLANT
SHELL TRIDENT II CLUST 50 (Shell) IMPLANT
SOLUTION IRRIG SURGIPHOR (IV SOLUTION) ×1 IMPLANT
STEM 37MM HIP (Hips) IMPLANT
SUCTION TUBE FRAZIER 10FR DISP (SUCTIONS) ×1 IMPLANT
SUT BONE WAX W31G (SUTURE) ×1 IMPLANT
SUT ETHIBOND 2 V 37 (SUTURE) ×1 IMPLANT
SUT MNCRL AB 3-0 PS2 18 (SUTURE) ×1 IMPLANT
SUT STRATAFIX 1PDS 45CM VIOLET (SUTURE) ×2 IMPLANT
SUT VIC AB 0 CT1 27XBRD ANBCTR (SUTURE) ×1 IMPLANT
SUT VIC AB 2-0 CT2 27 (SUTURE) ×2 IMPLANT
SYR 20ML LL LF (SYRINGE) ×1 IMPLANT
SYR 50ML LL SCALE MARK (SYRINGE) ×1 IMPLANT
TOWEL GREEN STERILE (TOWEL DISPOSABLE) ×1 IMPLANT
TRAY FOLEY W/BAG SLVR 16FR ST (SET/KITS/TRAYS/PACK) IMPLANT
TUBE SUCT ARGYLE STRL (TUBING) ×1 IMPLANT
WATER STERILE IRR 1000ML POUR (IV SOLUTION) ×1 IMPLANT

## 2023-05-29 NOTE — Progress Notes (Signed)
 Orthopedic Tech Progress Note Patient Details:  DUSTIN KRAS 1970/12/29 [id number]  Patient ID: Auburn Leak, female   DOB: [birth year], 53 y.o.   MRN: [id number] Level II; not currently needed. Toi Foster 05/29/2023, 12:44 AM

## 2023-05-29 NOTE — Evaluation (Signed)
 Physical Therapy Evaluation Patient Details Name: Suzanne Baker MRN: [id number] DOB: [birth year] Today's Date: 05/29/2023  History of Present Illness  53 y.o. female admitted 05/28/23 after fall at work sustaining R femoral neck fx. S/p R THA on 5/10. PMH includes scoliosis, tobacco use.  Clinical Impression  Pt presents with an overall decrease in functional mobility secondary to above. PTA, pt independent, active, working, lives alone; plans to stay at friend's home for increased assist. Educ on precautions, positioning, therex, and importance of mobility. Today, pt able to initiate transfer and gait training with RW and CGA; pt motivated to participate and hopeful for d/c home soon. Pt declines outpatient PT, therefore provided HEP for therex progression. Pt would benefit from continued acute PT services to maximize functional mobility and independence prior to d/c home.     SpO2 84-88% on RA with productive cough, replaced 2L O2 [place]     If plan is discharge home, recommend the following: A little help with bathing/dressing/bathroom;Assistance with cooking/housework;Assist for transportation;Help with stairs or ramp for entrance   Can travel by private vehicle        Equipment Recommendations Rolling walker (2 wheels)  Recommendations for Other Services       Functional Status Assessment Patient has had a recent decline in their functional status and demonstrates the ability to make significant improvements in function in a reasonable and predictable amount of time.     Precautions / Restrictions Precautions Precautions: Fall;Other (comment) Recall of Precautions/Restrictions: Intact Precaution/Restrictions Comments: watch SpO2 (does not wear baseline) Restrictions Weight Bearing Restrictions Per Provider Order: Yes RLE Weight Bearing Per Provider Order: Weight bearing as tolerated Other Position/Activity Restrictions: "NO posterior hip precautions"      Mobility  Bed  Mobility Overal bed mobility: Modified Independent             General bed mobility comments: HOB elevated, use of UEs to assist RLE to EOB    Transfers Overall transfer level: Needs assistance Equipment used: Rolling walker (2 wheels) Transfers: Sit to/from Stand Sit to Stand: Contact guard assist           General transfer comment: sit<>stand from EOB and recliner to RW with CGA, cues for hand placement and sequencing    Ambulation/Gait Ambulation/Gait assistance: Contact guard assist Gait Distance (Feet): 60 Feet Assistive device: Rolling walker (2 wheels) Gait Pattern/deviations: Step-to pattern, Step-through pattern, Decreased stride length, Decreased weight shift to right, Trunk flexed Gait velocity: Decreased     General Gait Details: slow, steady gait with RW and CGA for safety/lines; no overt instability or LOB; cues for step-through gait pattern and closer proximity to [employer] Mobility     Tilt Bed    Modified Rankin (Stroke Patients Only)       Balance Overall balance assessment: Needs assistance Sitting-balance support: No upper extremity supported, Feet supported Sitting balance-Leahy Scale: Good Sitting balance - Comments: able to don bilateral socks sitting EOB   Standing balance support: No upper extremity supported, During functional activity Standing balance-Leahy Scale: Fair Standing balance comment: can static stand and take step without DME; encouraged RW for added stability, especially with dynamic balance                             Pertinent Vitals/Pain Pain Assessment Pain Assessment: Faces Faces Pain Scale: Hurts little more Pain Location: R hip Pain  Descriptors / Indicators: Discomfort, Sore Pain Intervention(s): Monitored during session, Ice applied    Home Living Family/patient expects to be discharged to:: Private residence Living Arrangements: Alone Available Help at Discharge:  Friend(s);Available 24 hours/day Type of Home: Mobile home Home Access: Stairs to enter Entrance Stairs-Rails: Doctor, general practice of Steps: 4-5   Home Layout: One level Home Equipment: None Additional Comments: lives alone but plans to d/c to friend's home (home set up above); daughter to watch her two dogs    Prior Function Prior Level of Function : Independent/Modified Independent;Working/employed;Driving             Mobility Comments: indep without DME, works as Leisure centre manager, enjoys time with two dogs ADLs Comments: indep ADL/iADLs     Extremity/Trunk Assessment   Upper Extremity Assessment Upper Extremity Assessment: Overall WFL for tasks assessed    Lower Extremity Assessment Lower Extremity Assessment: RLE deficits/detail RLE Deficits / Details: s/p R THA with expected post-op pain and weakness; demonstates near full range LAQ; hip flex/abd <3/5 RLE Coordination: decreased gross motor       Communication   Communication Communication: No apparent difficulties    Cognition Arousal: Alert Behavior During Therapy: WFL for tasks assessed/performed, Flat affect   PT - Cognitive impairments: No apparent impairments                       PT - Cognition Comments: WFL for simple tasks; suspect some fatigue/flat affect related to medication and lack of sleep Following commands: Intact       Cueing       General Comments General comments (skin integrity, edema, etc.): educ re: role of acute PT, POC, precautions, positioning, edema control, DVT prevention, therex/HEP, activity recommendations, importance of mobility, d/c needs including assist from friends and RW use. pt declines outpatient PT, therefore provided therex progression via HEP, pt reports no questions or concerns with this    Exercises Other Exercises Other Exercises: [employer] HEP handout provided with LE therex progression as pt reports she does not have time for outpatient PT  ("I'll do it on my own... I'll make it work") Other Exercises: incentive spirometer x8, min cues for technique, pt pulling ~1000-1500 mL with productive cough   Assessment/Plan    PT Assessment Patient needs continued PT services  PT Problem List Decreased strength;Decreased activity tolerance;Decreased balance;Decreased mobility;Decreased knowledge of use of DME;Decreased knowledge of precautions;Cardiopulmonary status limiting activity;Pain       PT Treatment Interventions DME instruction;Gait training;Stair training;Functional mobility training;Therapeutic activities;Therapeutic exercise;Balance training;Patient/family education    PT Goals (Current goals can be found in the Care Plan section)  Acute Rehab PT Goals Patient Stated Goal: return home asap, get back to work PT Goal Formulation: With patient Time For Goal Achievement: 06/12/23 Potential to Achieve Goals: Good    Frequency Min 3X/week     Co-evaluation               AM-PAC PT "6 Clicks" Mobility  Outcome Measure Help needed turning from your back to your side while in a flat bed without using bedrails?: None Help needed moving from lying on your back to sitting on the side of a flat bed without using bedrails?: A Little Help needed moving to and from a bed to a chair (including a wheelchair)?: A Little Help needed standing up from a chair using your arms (e.g., wheelchair or bedside chair)?: A Little Help needed to walk in hospital room?: A Little Help needed climbing  3-5 steps with a railing? : A Little 6 Click Score: 19    End of Session Equipment Utilized During Treatment: Oxygen Activity Tolerance: Patient tolerated treatment well Patient left: in chair;with call bell/phone within reach;with chair alarm set Nurse Communication: Mobility status PT Visit Diagnosis: Other abnormalities of gait and mobility (R26.89);Pain Pain - Right/Left: Right Pain - part of body: Hip    Time: 4098-1191 PT Time  Calculation (min) (ACUTE ONLY): 34 min   Charges:   PT Evaluation $PT Eval Low Complexity: 1 Low PT Treatments $Therapeutic Activity: 8-22 mins PT General Charges $$ ACUTE PT VISIT: 1 Visit        Blase Bur, PT, DPT Acute Rehabilitation Services  Personal: Secure Chat Rehab Office: [phone]  Albino Hum 05/29/2023, 4:17 PM

## 2023-05-29 NOTE — H&P (Signed)
 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  She has significant pain and discomfort in the right hip area.  She was brought in by EMS and found to have a right femoral neck fracture.  Endorses a history of occasional bilateral hip and back pain but nothing severe.  She denies pain other joints or extremities.  She reports no prior medical history.   Past Medical History:  Diagnosis Date   Scoliosis    Past Surgical History:  Procedure Laterality Date   TONSILLECTOMY     Social History   Socioeconomic History   Marital status: Divorced    Spouse name: Not on file   Number of children: Not on file   Years of education: Not on file   Highest education level: Not on file  Occupational History   Not on file  Tobacco Use   Smoking status: Every Day    Current packs/day: 1.00    Types: Cigarettes   Smokeless tobacco: Never  Substance and Sexual Activity   Alcohol use: No   Drug use: No   Sexual activity: Not on file  Other Topics Concern   Not on file  Social History Narrative   Not on file   Social Drivers of Health   Financial Resource Strain: Not on file  Food Insecurity: Not on file  Transportation Needs: Not on file  Physical Activity: Not on file  Stress: Not on file  Social Connections: Not on file   History reviewed. No pertinent family history. No Known Allergies   Positive ROS: All other systems have been reviewed and were otherwise negative with the exception of those mentioned in the HPI and as above.  Physical Exam: General: Alert, no acute distress Cardiovascular: No pedal edema Respiratory: No cyanosis, no use of accessory musculature Skin: No lesions in the area of chief complaint Neurologic: Sensation intact distally Psychiatric: Patient is competent for consent with normal mood and affect  MUSCULOSKELETAL:   LLE No traumatic wounds, ecchymosis, or rash  Nontender  No groin pain with log roll  No knee or ankle effusion  Sens DPN, SPN, TN intact  Motor EHL, ext, flex 5/5  DP 2+, PT 2+, No significant edema  RLE No traumatic wounds, ecchymosis, or rash  Nontender groin pain with log roll  No knee or ankle effusion  Sens DPN, SPN, TN intact  Motor EHL, ext, flex 5/5  DP 2+, PT 2+, No significant edema    IMAGING: X-rays pelvis right hip demonstrate displaced right femoral neck fracture  Assessment: Principal Problem:   Closed displaced fracture of right femoral neck (HCC) Right displaced femoral neck fracture  Plan: Given patient's age and health would benefit from total hip arthroplasty to address her right femoral neck fracture.  Discussed that open reduction total fixation would have a high risk of nonunion given the amount of displacement of her fracture.  The risks benefits and alternatives were discussed with the patient including but not limited to the risks of nonoperative treatment, versus surgical intervention including infection, bleeding, nerve injury, periprosthetic fracture, the need for revision surgery, dislocation, leg length discrepancy, blood clots, cardiopulmonary complications, morbidity, mortality, among others, and they were willing to proceed.       Murleen Arms, MD  Contact information:   [email or web address] 7am-5pm epic message Dr. Pryor Browning, or  call office for patient follow up: [phone] After hours and holidays please check [email or web address] for group call information for [facility]

## 2023-05-29 NOTE — Anesthesia Preprocedure Evaluation (Addendum)
 Anesthesia Evaluation  Patient identified by MRN, date of birth, ID band Patient awake    Reviewed: Allergy & Precautions, NPO status , Patient's Chart, lab work & pertinent test results  History of Anesthesia Complications Negative for: history of anesthetic complications  Airway Mallampati: I  TM Distance: >3 FB Neck ROM: Full    Dental  (+) Edentulous Upper, Edentulous Lower   Pulmonary COPD,  COPD inhaler, Current Smoker and Patient abstained from smoking.   breath sounds clear to auscultation       Cardiovascular negative cardio ROS  Rhythm:Regular Rate:Normal     Neuro/Psych negative neurological ROS     GI/Hepatic Neg liver ROS,GERD  Medicated and Controlled,,  Endo/Other  negative endocrine ROS    Renal/GU negative Renal ROS     Musculoskeletal   Abdominal   Peds  Hematology Hb 12.3, plt 242k   Anesthesia Other Findings   Reproductive/Obstetrics                             Anesthesia Physical Anesthesia Plan  ASA: 2  Anesthesia Plan: General   Post-op Pain Management: Ofirmev  IV (intra-op)*   Induction: Intravenous  PONV Risk Score and Plan: 2 and Ondansetron  and Dexamethasone  Airway Management Planned: Oral ETT  Additional Equipment: None  Intra-op Plan:   Post-operative Plan: Extubation in OR  Informed Consent: I have reviewed the patients History and Physical, chart, labs and discussed the procedure including the risks, benefits and alternatives for the proposed anesthesia with the patient or authorized representative who has indicated his/her understanding and acceptance.       Plan Discussed with: CRNA and Surgeon  Anesthesia Plan Comments:         Anesthesia Quick Evaluation

## 2023-05-29 NOTE — Transfer of Care (Signed)
 Immediate Anesthesia Transfer of Care Note  Patient: Suzanne Baker  Procedure(s) Performed: ARTHROPLASTY, HIP, TOTAL,POSTERIOR APPROACH (Right: Hip)  Patient Location: PACU  Anesthesia Type:General  Level of Consciousness: awake  Airway & Oxygen Therapy: Patient Spontanous Breathing and Patient connected to nasal cannula oxygen  Post-op Assessment: Report given to RN and Post -op Vital signs reviewed and stable  Post vital signs: Reviewed and stable  Last Vitals:  Vitals Value Taken Time  BP 137/66 05/29/23 1100  Temp 36.7 C 05/29/23 1100  Pulse 81 05/29/23 1107  Resp 18 05/29/23 1107  SpO2 93 % 05/29/23 1107  Vitals shown include unfiled device data.  Last Pain:  Vitals:   05/29/23 1100  TempSrc:   PainSc: Asleep         Complications: No notable events documented.

## 2023-05-29 NOTE — Progress Notes (Addendum)
 PT Cancellation Note  Patient Details Name: TIANI BENVENUTI MRN: [id number] DOB: 1970-04-15   Cancelled Treatment:    Reason Eval/Treat Not Completed: Patient not medically ready. Patient with R femoral neck fx, awaiting surgical intervention. Will follow up for PT Evaluation post-op as appropriate.  Brooks Kinnan, PT, DPT Acute Rehabilitation Services  Personal: Secure Chat Rehab Office: [phone]  Stephannie Broner L Raidon Swanner 05/29/2023, 7:35 AM

## 2023-05-29 NOTE — Discharge Instructions (Signed)
 INSTRUCTIONS AFTER JOINT REPLACEMENT   Remove items at home which could result in a fall. This includes throw rugs or furniture in walking pathways ICE to the affected joint every three hours while awake for 30 minutes at a time, for at least the first 3-5 days, and then as needed for pain and swelling.  Continue to use ice for pain and swelling. You may notice swelling that will progress down to the foot and ankle.  This is normal after surgery.  Elevate your leg when you are not up walking on it.   Continue to use the breathing machine you got in the hospital (incentive spirometer) which will help keep your temperature down.  It is common for your temperature to cycle up and down following surgery, especially at night when you are not up moving around and exerting yourself.  The breathing machine keeps your lungs expanded and your temperature down.  DIET:  As you were doing prior to hospitalization, we recommend a well-balanced diet.  DRESSING / WOUND CARE / SHOWERING:  Keep the surgical dressing until follow up.  The dressing is water proof, so you can shower without any extra covering.  IF THE DRESSING FALLS OFF or the wound gets wet inside, change the dressing with sterile gauze.  Please use good hand washing techniques before changing the dressing.  Do not use any lotions or creams on the incision until instructed by your surgeon.    ACTIVITY  Increase activity slowly as tolerated, but follow the weight bearing instructions below.   No driving for 6 weeks or until further direction given by your physician.  You cannot drive while taking narcotics.  No lifting or carrying greater than 10 lbs. until further directed by your surgeon. Avoid periods of inactivity such as sitting longer than an hour when not asleep. This helps prevent blood clots.  You may return to work once you are authorized by your doctor.   WEIGHT BEARING: Weight bearing as tolerated with assist device (walker, cane, etc) as  directed, use it as long as suggested by your surgeon or therapist, typically at least 4-6 weeks.  EXERCISES  Results after joint replacement surgery are often greatly improved when you follow the exercise, range of motion and muscle strengthening exercises prescribed by your doctor. Safety measures are also important to protect the joint from further injury. Any time any of these exercises cause you to have increased pain or swelling, decrease what you are doing until you are comfortable again and then slowly increase them. If you have problems or questions, call your caregiver or physical therapist for advice.   Rehabilitation is important following a joint replacement. After just a few days of immobilization, the muscles of the leg can become weakened and shrink (atrophy).  These exercises are designed to build up the tone and strength of the thigh and leg muscles and to improve motion. Often times heat used for twenty to thirty minutes before working out will loosen up your tissues and help with improving the range of motion but do not use heat for the first two weeks following surgery (sometimes heat can increase post-operative swelling).   These exercises can be done on a training (exercise) mat, on the floor, on a table or on a bed. Use whatever works the best and is most comfortable for you.    Use music or television while you are exercising so that the exercises are a pleasant break in your day. This will make your life  better with the exercises acting as a break in your routine that you can look forward to.   Perform all exercises about fifteen times, three times per day or as directed.  You should exercise both the operative leg and the other leg as well.  Exercises include:   Quad Sets - Tighten up the muscle on the front of the thigh (Quad) and hold for 5-10 seconds.   Straight Leg Raises - With your knee straight (if you were given a brace, keep it on), lift the leg to 60 degrees, hold  for 3 seconds, and slowly lower the leg.  Perform this exercise against resistance later as your leg gets stronger.  Leg Slides: Lying on your back, slowly slide your foot toward your buttocks, bending your knee up off the floor (only go as far as is comfortable). Then slowly slide your foot back down until your leg is flat on the floor again.  Angel Wings: Lying on your back spread your legs to the side as far apart as you can without causing discomfort.  Hamstring Strength:  Lying on your back, push your heel against the floor with your leg straight by tightening up the muscles of your buttocks.  Repeat, but this time bend your knee to a comfortable angle, and push your heel against the floor.  You may put a pillow under the heel to make it more comfortable if necessary.   A rehabilitation program following joint replacement surgery can speed recovery and prevent re-injury in the future due to weakened muscles. Contact your doctor or a physical therapist for more information on knee rehabilitation.   CONSTIPATION:  Constipation is defined medically as fewer than three stools per week and severe constipation as less than one stool per week.  Even if you have a regular bowel pattern at home, your normal regimen is likely to be disrupted due to multiple reasons following surgery.  Combination of anesthesia, postoperative narcotics, change in appetite and fluid intake all can affect your bowels.   YOU MUST use at least one of the following options; they are listed in order of increasing strength to get the job done.  They are all available over the counter, and you may need to use some, POSSIBLY even all of these options:    Drink plenty of fluids (prune juice may be helpful) and high fiber foods Colace 100 mg by mouth twice a day  Senokot for constipation as directed and as needed Dulcolax (bisacodyl), take with full glass of water  Miralax (polyethylene glycol) once or twice a day as needed.  If you  have tried all these things and are unable to have a bowel movement in the first 3-4 days after surgery call either your surgeon or your primary doctor.    If you experience loose stools or diarrhea, hold the medications until you stool forms back up.  If your symptoms do not get better within 1 week or if they get worse, check with your doctor.  If you experience "the worst abdominal pain ever" or develop nausea or vomiting, please contact the office immediately for further recommendations for treatment.  ITCHING:  If you experience itching with your medications, try taking only a single pain pill, or even half a pain pill at a time.  You can also use Benadryl over the counter for itching or also to help with sleep.   TED HOSE STOCKINGS:  Use stockings on both legs until for at least 2 weeks or  as directed by physician office. They may be removed at night for sleeping.  MEDICATIONS:  See your medication summary on the "After Visit Summary" that nursing will review with you.  You may have some home medications which will be placed on hold until you complete the course of blood thinner medication.  It is important for you to complete the blood thinner medication as prescribed.  Blood clot prevention (DVT Prophylaxis): After surgery you are at an increased risk for a blood clot. you were prescribed a blood thinner, Aspirin 81mg , to be taken twice daily for a total of 4 weeks from surgery to help reduce your risk of getting a blood clot.  Signs of a pulmonary embolus (blood clot in the lungs) include sudden short of breath, feeling lightheaded or dizzy, chest pain with a deep breath, rapid pulse rapid breathing.  Signs of a blood clot in your arms or legs include new unexplained swelling and cramping, warm, red or darkened skin around the painful area.  Please call the office or 911 right away if these signs or symptoms develop.  PRECAUTIONS:   If you experience chest pain or shortness of breath - call 911  immediately for transfer to the hospital emergency department.   If you develop a fever greater that 101 F, purulent drainage from wound, increased redness or drainage from wound, foul odor from the wound/dressing, or calf pain - CONTACT YOUR SURGEON.                                                   FOLLOW-UP APPOINTMENTS:  If you do not already have a post-op appointment, please call the office for an appointment to be seen by your surgeon.  Guidelines for how soon to be seen are listed in your "After Visit Summary", but are typically between 2-3 weeks after surgery.  If you have a specialized bandage, you may be told to follow up 1 week after surgery.  POST-OPERATIVE OPIOID TAPER INSTRUCTIONS: It is important to wean off of your opioid medication as soon as possible. If you do not need pain medication after your surgery it is ok to stop day one. Opioids include: Codeine, Hydrocodone(Norco, Vicodin), Oxycodone(Percocet, oxycontin) and hydromorphone amongst others.  Long term and even short term use of opiods can cause: Increased pain response Dependence Constipation Depression Respiratory depression And more.  Withdrawal symptoms can include Flu like symptoms Nausea, vomiting And more Techniques to manage these symptoms Hydrate well Eat regular healthy meals Stay active Use relaxation techniques(deep breathing, meditating, yoga) Do Not substitute Alcohol to help with tapering If you have been on opioids for less than two weeks and do not have pain than it is ok to stop all together.  Plan to wean off of opioids This plan should start within one week post op of your joint replacement. Maintain the same interval or time between taking each dose and first decrease the dose.  Cut the total daily intake of opioids by one tablet each day Next start to increase the time between doses. The last dose that should be eliminated is the evening dose.   MAKE SURE YOU:  Understand these  instructions.  Get help right away if you are not doing well or get worse.    Thank you for letting us be a part of your medical care team.  It is a privilege we respect greatly.  We hope these instructions will help you stay on track for a fast and full recovery!

## 2023-05-29 NOTE — Anesthesia Postprocedure Evaluation (Signed)
 Anesthesia Post Note  Patient: Suzanne Baker  Procedure(s) Performed: ARTHROPLASTY, HIP, TOTAL,POSTERIOR APPROACH (Right: Hip)     Patient location during evaluation: PACU Anesthesia Type: General Level of consciousness: awake and alert, patient cooperative and oriented Pain management: pain level controlled Vital Signs Assessment: post-procedure vital signs reviewed and stable Respiratory status: spontaneous breathing, nonlabored ventilation, respiratory function stable and patient connected to nasal cannula oxygen (wheezing and cough improved with Albuterol) Cardiovascular status: blood pressure returned to baseline and stable Postop Assessment: no apparent nausea or vomiting Anesthetic complications: no   No notable events documented.  Last Vitals:  Vitals:   05/29/23 1145 05/29/23 1210  BP: 114/72 106/72  Pulse: 70 71  Resp: 11 16  Temp: 36.7 C 36.5 C  SpO2: 94% 92%    Last Pain:  Vitals:   05/29/23 1210  TempSrc: Oral  PainSc:                  Chemere Steffler,E. Jerris Fleer

## 2023-05-29 NOTE — Interval H&P Note (Signed)
The patient has been re-examined, and the chart reviewed, and there have been no interval changes to the documented history and physical.    Plan for R THA for R femoral neck fracture  The operative side was examined and the patient was confirmed to have sensation to DPN, SPN, TN intact, Motor EHL, ext, flex 5/5, and DP 2+, PT 2+, No significant edema.   The risks, benefits, and alternatives have been discussed at length with patient, and the patient is willing to proceed.  Right hip marked. Consent has been signed.

## 2023-05-29 NOTE — Anesthesia Procedure Notes (Signed)
 Procedure Name: Intubation Date/Time: 05/29/2023 8:35 AM  Performed by: Merna Aase, CRNAPre-anesthesia Checklist: Patient identified, Patient being monitored, Timeout performed, Emergency Drugs available and Suction available Patient Re-evaluated:Patient Re-evaluated prior to induction Oxygen Delivery Method: Circle system utilized Preoxygenation: Pre-oxygenation with 100% oxygen Induction Type: IV induction Ventilation: Mask ventilation without difficulty Laryngoscope Size: Mac and 4 Grade View: Grade I Tube type: Oral Tube size: 7.0 mm Number of attempts: 1 Airway Equipment and Method: Stylet Placement Confirmation: ETT inserted through vocal cords under direct vision, positive ETCO2 and breath sounds checked- equal and bilateral Secured at: 21 cm Tube secured with: Tape Dental Injury: Teeth and Oropharynx as per pre-operative assessment

## 2023-05-29 NOTE — Progress Notes (Signed)
 Callled [employer] office, spoke to [place],  as patient is requesting for her nicotine patch.

## 2023-05-29 NOTE — Op Note (Signed)
 05/29/2023  10:08 AM  PATIENT:  Suzanne Baker   MRN: [id number]  PRE-OPERATIVE DIAGNOSIS: Right displaced femoral neck fracture  POST-OPERATIVE DIAGNOSIS:  same  PROCEDURE: Right total hip arthroplasty  PREOPERATIVE INDICATIONS:    Suzanne Baker is an 53 y.o. female who has a diagnosis of Right displaced femoral neck fracture and elected for surgical management after failing conservative treatment.  The risks benefits and alternatives were discussed with the patient including but not limited to the risks of nonoperative treatment, versus surgical intervention including infection, bleeding, nerve injury, periprosthetic fracture, the need for revision surgery, dislocation, leg length discrepancy, blood clots, cardiopulmonary complications, morbidity, mortality, among others, and they were willing to proceed.     OPERATIVE REPORT     SURGEON:  Priscille Brought, MD    ASSISTANT: Mason Sole, PA-C, (Present throughout the entire procedure,  necessary for completion of procedure in a timely manner, assisting with retraction, instrumentation, and closure)     ANESTHESIA: General  ESTIMATED BLOOD LOSS: 250cc    COMPLICATIONS:  None.     COMPONENTS:   Trident 250 mm acetabular shell, 6.5 peg screws x 2, neutral X.3 polyethylene liner alpha code D, Accolade 2 size #7 stem with 127 degree neck angle, 36+0 ceramic head Implant Name Type Inv. Item Serial No. Manufacturer Lot No. LRB No. Used Action  SHELL TRIDENT II CLUST 50 - [id number] Shell SHELL TRIDENT II CLUST 50  [employer] [id number] A Right 1 Implanted  SCREW HEX LP 6.5X25 - [id number] Screw SCREW HEX LP 6.5X25  [employer] [id number] Right 1 Implanted  SCREW HEX LP 6.5X15 - [id number] Screw SCREW HEX LP 6.5X15  [employer] JREA Right 1 Implanted  HEAD CERAMIC FEMORAL - [id number] Head HEAD CERAMIC FEMORAL  [employer] [id number] Right 1 Implanted  INSERT 0 DEGREE 36 - [id number] Miscellaneous  INSERT 0 DEGREE 36  [employer] [id number] Right 1 Implanted  STEM HIP - [id number] Hips STEM HIP  [employer] [id number] A Right 1 Implanted      PROCEDURE IN DETAIL:   The patient was met in the holding area and  identified.  The appropriate hip was identified and marked at the operative site.  The patient was then transported to the OR  and  placed under anesthesia.  At that point, the patient was  placed in the lateral decubitus position with the operative side up and  secured to the operating room table  and all bony prominences padded. A subaxillary role was also placed.    The operative lower extremity was prepped from the iliac crest to the distal leg.  Sterile draping was performed.  Preoperative antibiotics, 2 gm of ancef,1 gm of Tranexamic Acid, and 8 mg of Decadron administered. Time out was performed prior to incision.      A routine posterolateral approach was utilized via sharp dissection  carried down to the subcutaneous tissue.  Gross bleeders were Bovie coagulated.  The iliotibial band was identified and incised along the length of the skin incision through the glute max fascia.  Charnley retractor was placed with care to protect the sciatic nerve posteriorly.  With the hip internally rotated, the piriformis tendon was identified and released from the femoral insertion and tagged with a #5 Ethibond.  A capsulotomy was then performed off the femoral insertion and also tagged with a #5 Ethibond.    The femoral neck was exposed, and I resected the femoral neck based on preoperative templating relative to the  lesser trochanter.    I then exposed the deep acetabulum, cleared out any tissue including the ligamentum teres.  After adequate visualization, I excised the labrum.  I then started reaming with a 46 mm reamer, first medializing to the floor of the cotyloid fossa, and then in the position of the cup aiming towards the greater sciatic notch, matching the version of  the transverse acetabular ligament and tucked under the anterior wall. I reamed up to 50 mm reamer with good bony bed preparation and a 50 mm cup was chosen.  The real cup was then impacted into place.  Appropriate version and inclination was confirmed clinically matching their bony anatomy, and also with the use of the jig.  I placed 2 screws in the posterior superior quadrant to augment fixation.  A neutral liner was placed and impacted. It was confirmed to be appropriately seated and the acetabular retractors were removed.    I then prepared the proximal femur using the box cutter, Charnley awl, and then sequentially broached starting with 0 up to a size 7.  A trial broach, neck, and head was utilized, and I reduced the hip and it was found to have excellent stability.  There was no impingement with full extension and 90 degrees external rotation.  The hip was stable at the position of sleep and with 90 degrees flexion and 80 degrees of internal rotation.  Leg lengths were also clinically assessed in the lateral position and felt to be equal. Intra-Op flatplate was obtained and confirmed appropriate component positions.  Good fill of the femur with the size 7 broach.  And restoration of leg length and offset. No evidence or concern for fracture.  A final femoral prosthesis size 7 was selected. I then impacted the real femoral prosthesis into place.I again trialed and selected a 36+ 0mm ball. The hip was then reduced and taken through a range of motion. There was no impingement with full extension and 90 degrees external rotation.  The hip was stable at the position of sleep and with 90 degrees flexion and 70 degrees of internal rotation. Leg lengths were  again assessed and felt to be restored.  We then opened, and I impacted the real head ball into place.  The posterior capsule was then closed with #5 Ethibond.  The piriformis was repaired through the base of the abductor tendon using a [place] suture  passer.  I then irrigated the hip copiously with dilute Betadine and with normal saline pulse lavage. Periarticular injection was then performed with Exparel.   We repaired the fascia #1 barbed suture, followed by 0 barbed suture for the subcutaneous fat.  Skin was closed with 2-0 Vicryl and 3-0 Monocryl.  Dermabond and Aquacel dressing were applied. The patient was then awakened and returned to PACU in stable and satisfactory condition.  Leg lengths in the supine position were assessed and felt to be clinically equal. There were no complications.  Post op recs: WB: WBAT RLE, No formal hip precautions Abx: ancef Imaging: PACU pelvis Xray Dressing: Aquacell, keep intact until follow up DVT prophylaxis: Aspirin 81BID starting POD1 Follow up: 2 weeks after surgery for a wound check with Dr. Pryor Browning at [facility].  Address: [address], [place], [place] [address]  Office Phone: [phone]   Priscille Brought, MD Orthopedic Surgeon

## 2023-05-29 NOTE — ED Provider Notes (Signed)
  Physical Exam  BP (!) 152/109   Pulse 96   Temp (!) 97.4 F (36.3 C) (Temporal)   Resp (!) 30   Ht 5\' 6"  (1.676 m)   Wt 72.6 kg   LMP 03/06/2013   SpO2 97%   BMI 25.82 kg/m   Physical Exam  Procedures  Procedures  ED Course / MDM   Clinical Course as of 05/29/23 0042  Fri May 28, 2023  2357 Consult to Dr. Pryor Browning, ortho, who will consult on the patient in the ED. I appreciate his collaboration in the care of this patient.  [RS]    Clinical Course User Index [RS] Briana Newman, Adelle Agent, PA-C   Medical Decision Making Amount and/or Complexity of Data Reviewed Labs: ordered.    Details: CBC leukocytosis of 14,000, CMP with mild hypokalemia of 3.4.  Lactic is normal. Radiology: ordered.  Risk Prescription drug management. Decision regarding hospitalization.   Care of this patient assumed from preceding ED provider Dr. Emory Harps, Resident physician.  Please see his associated note for further insight patient ED course.  In brief patient is a 53 year old female who had a mechanical fall at work tonight with severe right hip pain and deformity x-ray concerning for acute right femoral neck fracture.  At time of shift change care of this patient was assumed pending orthopedic consult and admission.  Care of this patient assumed pending Ortho consult and admission.  Consult orthopedics as above, patient admitted to their service given lack of comorbidities.   This chart was dictated using voice recognition software, Dragon. Despite the best efforts of this provider to proofread and correct errors, errors may still occur which can change documentation meaning.     Kae Oram, PA-C 05/29/23 0046    Eldon Greenland, MD 05/29/23 [phone]

## 2023-05-30 ENCOUNTER — Inpatient Hospital Stay (HOSPITAL_COMMUNITY): Payer: Self-pay

## 2023-05-30 LAB — CBC
HCT: 32 % — ABNORMAL LOW (ref 36.0–46.0)
Hemoglobin: 10.4 g/dL — ABNORMAL LOW (ref 12.0–15.0)
MCH: 31.1 pg (ref 26.0–34.0)
MCHC: 32.5 g/dL (ref 30.0–36.0)
MCV: 95.8 fL (ref 80.0–100.0)
Platelets: 178 10*3/uL (ref 150–400)
RBC: 3.34 MIL/uL — ABNORMAL LOW (ref 3.87–5.11)
RDW: 12.7 % (ref 11.5–15.5)
WBC: 13.3 10*3/uL — ABNORMAL HIGH (ref 4.0–10.5)
nRBC: 0 % (ref 0.0–0.2)

## 2023-05-30 LAB — BASIC METABOLIC PANEL WITH GFR
Anion gap: 9 (ref 5–15)
BUN: <5 mg/dL — ABNORMAL LOW (ref 6–20)
CO2: 27 mmol/L (ref 22–32)
Calcium: 8.3 mg/dL — ABNORMAL LOW (ref 8.9–10.3)
Chloride: 100 mmol/L (ref 98–111)
Creatinine, Ser: 0.81 mg/dL (ref 0.44–1.00)
GFR, Estimated: >60 mL/min (ref >60)
Glucose, Bld: 121 mg/dL — ABNORMAL HIGH (ref 70–99)
Potassium: 3.2 mmol/L — ABNORMAL LOW (ref 3.5–5.1)
Sodium: 136 mmol/L (ref 135–145)

## 2023-05-30 MED ORDER — HYDROCODONE-ACETAMINOPHEN 10-325 MG PO TABS: 1.0000 | ORAL_TABLET | ORAL | 0 refills | Status: AC | PRN

## 2023-05-30 MED ORDER — HYDROCODONE-ACETAMINOPHEN 5-325 MG PO TABS: 1.0000 | ORAL_TABLET | ORAL | Status: DC | PRN

## 2023-05-30 MED ORDER — HYDROCODONE-ACETAMINOPHEN 10-325 MG PO TABS: 1.0000 | ORAL_TABLET | ORAL | Status: DC | PRN

## 2023-05-30 NOTE — Progress Notes (Signed)
 Subjective: 1 Day Post-Op s/p Procedure(s): ARTHROPLASTY, HIP, TOTAL,POSTERIOR APPROACH   Patient is alert, oriented. Patient reports pain as so far well controlled on pain medication.  Denies chest pain, SOB, Calf pain. No nausea/vomiting.   Patient is a at least 1 pack a day smoker at baseline. She's had a productive cough since surgery, but states this is normal for her. She normally has a productive cough in the mornings but is able to resolve it when getting up and moving.     Objective:  PE: VITALS:   Vitals:   05/29/23 1210 05/29/23 1654 05/29/23 2000 05/30/23 0242  BP: 106/72 99/67 117/69 118/76  Pulse: 71 72 77 93  Resp: 16 16 18 20   Temp: 97.7 F (36.5 C) 98.1 F (36.7 C) 98.1 F (36.7 C) 98.7 F (37.1 C)  TempSrc: Oral Oral Oral Oral  SpO2: 92% 96% 92% 93%  Weight:      Height:       General: laying in bed in no acute distress Resp: nasal canula in place, normal respiratory effort MSK:  Sensation intact distally Intact pulses distally Dorsiflexion/Plantar flexion intact Incision: dressing C/D/I  LABS  Results for orders placed or performed during the hospital encounter of 05/28/23 (from the past 24 hours)  CBC     Status: Abnormal   Collection Time: 05/30/23  6:27 AM  Result Value Ref Range   WBC 13.3 (H) 4.0 - 10.5 K/uL   RBC 3.34 (L) 3.87 - 5.11 MIL/uL   Hemoglobin 10.4 (L) 12.0 - 15.0 g/dL   HCT 25.3 (L) 66.4 - 40.3 %   MCV 95.8 80.0 - 100.0 fL   MCH 31.1 26.0 - 34.0 pg   MCHC 32.5 30.0 - 36.0 g/dL   RDW 47.4 25.9 - 56.3 %   Platelets 178 150 - 400 K/uL   nRBC 0.0 0.0 - 0.2 %    DG HIP UNILAT W OR W/O PELVIS 2-3 VIEWS RIGHT Result Date: 05/29/2023 CLINICAL DATA:  Postop. EXAM: DG HIP (WITH OR WITHOUT PELVIS) 2-3V RIGHT COMPARISON:  Preoperative imaging FINDINGS: Right hip arthroplasty in expected alignment. No periprosthetic lucency or fracture. Recent postsurgical change includes air and edema in the soft tissues. IMPRESSION: Right hip  arthroplasty without immediate postoperative complication. Electronically Signed   By: Chadwick Colonel M.D.   On: 05/29/2023 12:21   DG HIP UNILAT WITH PELVIS 1V RIGHT Result Date: 05/29/2023 CLINICAL DATA:  Elective surgery, intraop. EXAM: DG HIP (WITH OR WITHOUT PELVIS) 1V RIGHT COMPARISON:  Preoperative imaging. FINDINGS: Two intraoperative spot views of the pelvis and right hip submitted from the operating room. Acetabular component of right hip arthroplasty is in place. There is a femoral spacer. Expected postsurgical changes in the soft tissues. IMPRESSION: Intraoperative spot views during right hip arthroplasty. Electronically Signed   By: Chadwick Colonel M.D.   On: 05/29/2023 10:30   DG Chest Port 1 View Result Date: 05/29/2023 CLINICAL DATA:  Trauma, hip fracture. EXAM: PORTABLE CHEST 1 VIEW COMPARISON:  08/13/2016 FINDINGS: The cardiomediastinal contours are normal. The lungs are clear. Pulmonary vasculature is normal. No consolidation, pleural effusion, or pneumothorax. No acute osseous abnormalities are seen. IMPRESSION: No active disease. Electronically Signed   By: Chadwick Colonel M.D.   On: 05/29/2023 00:05   DG Pelvis Portable Result Date: 05/29/2023 CLINICAL DATA:  Fall onto right hip with pain. EXAM: RIGHT FEMUR PORTABLE 1 VIEW; PORTABLE PELVIS 1-2 VIEWS COMPARISON:  None Available. FINDINGS: Pelvis: Displaced right femoral neck fracture.  Mild proximal migration of the femoral shaft. The femoral head is well seated. Remainder of the pelvis is intact. The pubic rami are intact. No pubic symphyseal or sacroiliac diastasis. Femur: Only the upper femur is included on this single frontal view. Displaced femoral neck fracture. No additional fracture of the included femur. IMPRESSION: Displaced right femoral neck fracture. Electronically Signed   By: Chadwick Colonel M.D.   On: 05/29/2023 00:05   DG FEMUR PORT, 1V RIGHT Result Date: 05/29/2023 CLINICAL DATA:  Fall onto right hip with pain.  EXAM: RIGHT FEMUR PORTABLE 1 VIEW; PORTABLE PELVIS 1-2 VIEWS COMPARISON:  None Available. FINDINGS: Pelvis: Displaced right femoral neck fracture. Mild proximal migration of the femoral shaft. The femoral head is well seated. Remainder of the pelvis is intact. The pubic rami are intact. No pubic symphyseal or sacroiliac diastasis. Femur: Only the upper femur is included on this single frontal view. Displaced femoral neck fracture. No additional fracture of the included femur. IMPRESSION: Displaced right femoral neck fracture. Electronically Signed   By: Chadwick Colonel M.D.   On: 05/29/2023 00:05    Assessment/Plan: Principal Problem:   Closed displaced fracture of right femoral neck (HCC)  1 Day Post-Op s/p Procedure(s): ARTHROPLASTY, HIP, TOTAL,POSTERIOR APPROACH - plan to work with PT today and hopefully discharge home after PT - chest x-ray ordered, no active pulmonary disease - patient does not want formal PT, feels like she has enough support to discharge home  WB: WBAT RLE, No formal hip precautions Abx: ancef Imaging: PACU pelvis Xray Dressing: Aquacell, keep intact until follow up DVT prophylaxis: Aspirin 81BID starting POD1 Follow up: 2 weeks after surgery for a wound check with Dr. Pryor Browning at [facility].  Address: [address], [place], [place] [address]  Office Phone: [phone]   Contact information:   Hurshel Maidens, PA-C [id number]  After hours and holidays please check [email or web address] for group call information for [facility]  Abraham Hoffmann 05/30/2023, 7:42 AM

## 2023-05-30 NOTE — TOC Transition Note (Signed)
 Transition of Care [facility]) - Discharge Note   Patient Details  Name: Suzanne Baker MRN: [id number] Date of Birth: 1970/04/28  Transition of Care [facility]) CM/SW Contact:  Jannine Meo, RN Phone Number: 05/30/2023, 12:31 PM   Clinical Narrative:   Patient is being discharged today. Recommendations for RW noted. Spoke with patient by phone, confirmed she can pay for RW out of pocket. RW ordered through Jermaine with [employer] to be delivered to bedside before discharging home.   Final next level of care: Home/Self Care Barriers to Discharge: No Barriers Identified   Patient Goals and CMS Choice            Discharge Placement                       Discharge Plan and Services Additional resources added to the After Visit Summary for                  DME Arranged: Walker rolling DME Agency: [employer] Date DME Agency Contacted: 05/30/23 Time DME Agency Contacted: 1230 Representative spoke with at DME Agency: Zula Hitch            Social Drivers of Health (SDOH) Interventions SDOH Screenings   Food Insecurity: No Food Insecurity (05/29/2023)  Housing: Low Risk  (05/29/2023)  Transportation Needs: No Transportation Needs (05/29/2023)  Utilities: Not At Risk (05/29/2023)  Tobacco Use: High Risk (05/29/2023)     Readmission Risk Interventions     No data to display

## 2023-05-30 NOTE — Progress Notes (Signed)
 Physical Therapy Treatment Patient Details Name: Suzanne Baker MRN: [id number] DOB: 13-May-1970 Today's Date: 05/30/2023   History of Present Illness 53 y.o. female admitted 05/28/23 after fall at work sustaining R femoral neck fx. S/p R THA on 5/10. PMH includes scoliosis, tobacco use.    PT Comments  Continuing work on functional mobility and activity tolerance;  Session foucsed on progressive ambulationa d stair training in prep for dc today;  Questions solicited and answered; OK for dc home from PT standpoint    If plan is discharge home, recommend the following: A little help with bathing/dressing/bathroom;Assistance with cooking/housework;Assist for transportation;Help with stairs or ramp for entrance   Can travel by private vehicle        Equipment Recommendations  Rolling walker (2 wheels)    Recommendations for Other Services       Precautions / Restrictions Precautions Precautions: Fall;Other (comment) Recall of Precautions/Restrictions: Intact Precaution/Restrictions Comments: watch SpO2 (does not wear baseline) Restrictions RLE Weight Bearing Per Provider Order: Weight bearing as tolerated     Mobility  Bed Mobility Overal bed mobility: Modified Independent             General bed mobility comments: HOB elevated, use of UEs to assist RLE to EOB    Transfers Overall transfer level: Needs assistance Equipment used: Rolling walker (2 wheels) Transfers: Sit to/from Stand Sit to Stand: Contact guard assist           General transfer comment: sit<>stand from EOB and recliner to RW with CGA, cues for hand placement and sequencing    Ambulation/Gait Ambulation/Gait assistance: Supervision Gait Distance (Feet): 180 Feet Assistive device: Rolling walker (2 wheels) Gait Pattern/deviations: Step-to pattern, Step-through pattern, Decreased stride length, Decreased weight shift to right, Trunk flexed       General Gait Details: slow, steady gait with RW  Supervision for safety; no overt instability or LOB; cues for step-through gait pattern and closer proximity to RW   Stairs Stairs: Yes Stairs assistance: Contact guard assist Stair Management: One rail Right, Step to pattern, Forwards Number of Stairs: 5 General stair comments: Demo cues for sequence; managing well   Wheelchair Mobility     Tilt Bed    Modified Rankin (Stroke Patients Only)       Balance     Sitting balance-Leahy Scale: Good       Standing balance-Leahy Scale: Fair                              Hotel manager: No apparent difficulties  Cognition Arousal: Alert Behavior During Therapy: WFL for tasks assessed/performed, Flat affect   PT - Cognitive impairments: No apparent impairments                         Following commands: Intact      Cueing    Exercises      General Comments General comments (skin integrity, edema, etc.): REviewed HEP and what she can anticipate with her recovery      Pertinent Vitals/Pain Pain Assessment Pain Assessment: Faces Faces Pain Scale: Hurts little more Pain Location: R hip Pain Descriptors / Indicators: Discomfort, Sore Pain Intervention(s): Monitored during session    Home Living                          Prior Function  PT Goals (current goals can now be found in the care plan section) Acute Rehab PT Goals Patient Stated Goal: return home asap, get back to work PT Goal Formulation: With patient Time For Goal Achievement: 06/12/23 Potential to Achieve Goals: Good Progress towards PT goals: Progressing toward goals    Frequency    Min 3X/week      PT Plan      Co-evaluation              AM-PAC PT "6 Clicks" Mobility   Outcome Measure  Help needed turning from your back to your side while in a flat bed without using bedrails?: None Help needed moving from lying on your back to sitting on the side of a flat  bed without using bedrails?: A Little Help needed moving to and from a bed to a chair (including a wheelchair)?: A Little Help needed standing up from a chair using your arms (e.g., wheelchair or bedside chair)?: A Little Help needed to walk in hospital room?: A Little Help needed climbing 3-5 steps with a railing? : A Little 6 Click Score: 19    End of Session   Activity Tolerance: Patient tolerated treatment well Patient left: in chair;with call bell/phone within reach;with chair alarm set Nurse Communication: Mobility status (and ok for dc) PT Visit Diagnosis: Other abnormalities of gait and mobility (R26.89);Pain Pain - Right/Left: Right Pain - part of body: Hip     Time: 1221-1259 PT Time Calculation (min) (ACUTE ONLY): 38 min  Charges:    $Gait Training: 23-37 mins $Therapeutic Activity: 8-22 mins PT General Charges $$ ACUTE PT VISIT: 1 Visit                     Darcus Eastern, PT  Acute Rehabilitation Services Office [phone] Secure Chat welcomed    Marcial Setting 05/30/2023, 4:53 PM

## 2023-05-30 NOTE — Care Management (Cosign Needed)
    Durable Medical Equipment  (From admission, onward)           Start     Ordered   05/30/23 1234  For home use only DME Walker rolling  Once       Question Answer Comment  Walker: With 5 Inch Wheels   Patient needs a walker to treat with the following condition Weakness      05/30/23 1233

## 2023-05-30 NOTE — Plan of Care (Signed)
  Problem: Clinical Measurements: Goal: Will remain free from infection Outcome: Not Progressing   Problem: Clinical Measurements: Goal: Respiratory complications will improve Outcome: Not Progressing   Problem: Activity: Goal: Risk for activity intolerance will decrease Outcome: Not Progressing   Problem: Pain Managment: Goal: General experience of comfort will improve and/or be controlled Outcome: Not Progressing   Problem: Safety: Goal: Ability to remain free from injury will improve Outcome: Not Progressing   Problem: Skin Integrity: Goal: Risk for impaired skin integrity will decrease Outcome: Not Progressing

## 2023-05-30 NOTE — Discharge Summary (Signed)
 Discharge Summary  Patient ID: Suzanne Baker MRN: [id number] DOB/AGE: 1970-02-10 53 y.o.  Admit date: 05/28/2023 Discharge date: 05/30/2023  Admission Diagnoses:  Closed displaced fracture of right femoral neck [facility])  Discharge Diagnoses:  Principal Problem:   Closed displaced fracture of right femoral neck (HCC)   Past Medical History:  Diagnosis Date   GERD (gastroesophageal reflux disease)    Scoliosis     Surgeries: Procedure(s): ARTHROPLASTY, HIP, TOTAL,POSTERIOR APPROACH on 05/29/2023   Consultants (if any):   Discharged Condition: Improved  Hospital Course: AYME REINTS is an 53 y.o. female who was admitted 05/28/2023 with a diagnosis of Closed displaced fracture of right femoral neck (HCC) and went to the operating room on 05/29/2023 and underwent the above named procedures.    She was given perioperative antibiotics:  Anti-infectives (From admission, onward)    Start     Dose/Rate Route Frequency Ordered Stop   05/29/23 0749  ceFAZolin (ANCEF) 2-4 GM/100ML-% IVPB       Note to Pharmacy: Paulino Bosch N: cabinet override      05/29/23 0749 05/29/23 0902   05/29/23 0745  ceFAZolin (ANCEF) IVPB 2g/100 mL premix        2 g 200 mL/hr over 30 Minutes Intravenous On call to O.R. 05/29/23 4098 05/29/23 0825     .  She was given sequential compression devices, early ambulation, and aspirin for DVT prophylaxis.  She benefited maximally from the hospital stay and there were no complications.    Recent vital signs:  Vitals:   05/30/23 0242 05/30/23 0936  BP: 118/76 (!) 140/78  Pulse: 93 89  Resp: 20 18  Temp: 98.7 F (37.1 C) 99.2 F (37.3 C)  SpO2: 93% 91%    Recent laboratory studies:  Lab Results  Component Value Date   HGB 10.4 (L) 05/30/2023   HGB 12.3 05/29/2023   HGB 13.9 05/28/2023   Lab Results  Component Value Date   WBC 13.3 (H) 05/30/2023   PLT 178 05/30/2023   Lab Results  Component Value Date   INR 1.0 05/28/2023   Lab Results   Component Value Date   NA 136 05/30/2023   K 3.2 (L) 05/30/2023   CL 100 05/30/2023   CO2 27 05/30/2023   BUN <5 (L) 05/30/2023   CREATININE 0.81 05/30/2023   GLUCOSE 121 (H) 05/30/2023    Discharge Medications:   Allergies as of 05/30/2023   No Known Allergies      Medication List     TAKE these medications    aspirin EC 81 MG tablet Take 1 tablet (81 mg total) by mouth 2 (two) times daily for 28 days. Swallow whole.   celecoxib 100 MG capsule Commonly known as: CeleBREX Take 1 capsule (100 mg total) by mouth 2 (two) times daily for 14 days.   HYDROcodone -acetaminophen  10-325 MG tablet Commonly known as: NORCO Take 1 tablet by mouth every 4 (four) hours as needed for severe pain (pain score 7-10).   methocarbamol 500 MG tablet Commonly known as: ROBAXIN Take 1 tablet (500 mg total) by mouth every 8 (eight) hours as needed for up to 10 days for muscle spasms.   omeprazole 20 MG tablet Commonly known as: PRILOSEC OTC Take 20 mg by mouth daily as needed (for acid reflux).   ondansetron  4 MG tablet Commonly known as: Zofran  Take 1 tablet (4 mg total) by mouth every 8 (eight) hours as needed for up to 14 days for nausea or vomiting.  polyethylene glycol 17 g packet Commonly known as: MiraLax Take 17 g by mouth daily.               Durable Medical Equipment  (From admission, onward)           Start     Ordered   05/30/23 1234  For home use only DME Walker rolling  Once       Question Answer Comment  Walker: With 5 Inch Wheels   Patient needs a walker to treat with the following condition Weakness      05/30/23 1233            Diagnostic Studies: DG Chest 2 View Result Date: 05/30/2023 CLINICAL DATA:  Cough EXAM: CHEST - 2 VIEW COMPARISON:  05/28/2023 FINDINGS: The heart size and mediastinal contours are within normal limits. Both lungs are clear. The visualized skeletal structures are unremarkable. IMPRESSION: No active cardiopulmonary  disease. Electronically Signed   By: Juanetta Nordmann M.D.   On: 05/30/2023 11:34   DG HIP UNILAT W OR W/O PELVIS 2-3 VIEWS RIGHT Result Date: 05/29/2023 CLINICAL DATA:  Postop. EXAM: DG HIP (WITH OR WITHOUT PELVIS) 2-3V RIGHT COMPARISON:  Preoperative imaging FINDINGS: Right hip arthroplasty in expected alignment. No periprosthetic lucency or fracture. Recent postsurgical change includes air and edema in the soft tissues. IMPRESSION: Right hip arthroplasty without immediate postoperative complication. Electronically Signed   By: Chadwick Colonel M.D.   On: 05/29/2023 12:21   DG HIP UNILAT WITH PELVIS 1V RIGHT Result Date: 05/29/2023 CLINICAL DATA:  Elective surgery, intraop. EXAM: DG HIP (WITH OR WITHOUT PELVIS) 1V RIGHT COMPARISON:  Preoperative imaging. FINDINGS: Two intraoperative spot views of the pelvis and right hip submitted from the operating room. Acetabular component of right hip arthroplasty is in place. There is a femoral spacer. Expected postsurgical changes in the soft tissues. IMPRESSION: Intraoperative spot views during right hip arthroplasty. Electronically Signed   By: Chadwick Colonel M.D.   On: 05/29/2023 10:30   DG Chest Port 1 View Result Date: 05/29/2023 CLINICAL DATA:  Trauma, hip fracture. EXAM: PORTABLE CHEST 1 VIEW COMPARISON:  08/13/2016 FINDINGS: The cardiomediastinal contours are normal. The lungs are clear. Pulmonary vasculature is normal. No consolidation, pleural effusion, or pneumothorax. No acute osseous abnormalities are seen. IMPRESSION: No active disease. Electronically Signed   By: Chadwick Colonel M.D.   On: 05/29/2023 00:05   DG Pelvis Portable Result Date: 05/29/2023 CLINICAL DATA:  Fall onto right hip with pain. EXAM: RIGHT FEMUR PORTABLE 1 VIEW; PORTABLE PELVIS 1-2 VIEWS COMPARISON:  None Available. FINDINGS: Pelvis: Displaced right femoral neck fracture. Mild proximal migration of the femoral shaft. The femoral head is well seated. Remainder of the pelvis is  intact. The pubic rami are intact. No pubic symphyseal or sacroiliac diastasis. Femur: Only the upper femur is included on this single frontal view. Displaced femoral neck fracture. No additional fracture of the included femur. IMPRESSION: Displaced right femoral neck fracture. Electronically Signed   By: Chadwick Colonel M.D.   On: 05/29/2023 00:05   DG FEMUR PORT, 1V RIGHT Result Date: 05/29/2023 CLINICAL DATA:  Fall onto right hip with pain. EXAM: RIGHT FEMUR PORTABLE 1 VIEW; PORTABLE PELVIS 1-2 VIEWS COMPARISON:  None Available. FINDINGS: Pelvis: Displaced right femoral neck fracture. Mild proximal migration of the femoral shaft. The femoral head is well seated. Remainder of the pelvis is intact. The pubic rami are intact. No pubic symphyseal or sacroiliac diastasis. Femur: Only the upper femur is included on this  single frontal view. Displaced femoral neck fracture. No additional fracture of the included femur. IMPRESSION: Displaced right femoral neck fracture. Electronically Signed   By: Chadwick Colonel M.D.   On: 05/29/2023 00:05    Disposition: Discharge disposition: 01-Home or Self Care       Discharge Instructions     Call MD / Call 911   Complete by: As directed    If you experience chest pain or shortness of breath, CALL 911 and be transported to the hospital emergency room.  If you develope a fever above 101 F, pus (white drainage) or increased drainage or redness at the wound, or calf pain, call your surgeon's office.   Constipation Prevention   Complete by: As directed    Drink plenty of fluids.  Prune juice may be helpful.  You may use a stool softener, such as Colace (over the counter) 100 mg twice a day.  Use MiraLax (over the counter) for constipation as needed.   Diet - low sodium heart healthy   Complete by: As directed    Driving restrictions   Complete by: As directed    No driving for 4-6 weeks   Follow the hip precautions as taught in Physical Therapy   Complete  by: As directed    Increase activity slowly as tolerated   Complete by: As directed    Post-operative opioid taper instructions:   Complete by: As directed    POST-OPERATIVE OPIOID TAPER INSTRUCTIONS: It is important to wean off of your opioid medication as soon as possible. If you do not need pain medication after your surgery it is ok to stop day one. Opioids include: Codeine, Hydrocodone (Norco, Vicodin), Oxycodone(Percocet, oxycontin) and hydromorphone amongst others.  Long term and even short term use of opiods can cause: Increased pain response Dependence Constipation Depression Respiratory depression And more.  Withdrawal symptoms can include Flu like symptoms Nausea, vomiting And more Techniques to manage these symptoms Hydrate well Eat regular healthy meals Stay active Use relaxation techniques(deep breathing, meditating, yoga) Do Not substitute Alcohol to help with tapering If you have been on opioids for less than two weeks and do not have pain than it is ok to stop all together.  Plan to wean off of opioids This plan should start within one week post op of your joint replacement. Maintain the same interval or time between taking each dose and first decrease the dose.  Cut the total daily intake of opioids by one tablet each day Next start to increase the time between doses. The last dose that should be eliminated is the evening dose.      TED hose   Complete by: As directed    Use stockings (TED hose) for 2 weeks on both leg(s).  Then for 2 more weeks on the surgical leg.  You may remove them at night for sleeping.        Follow-up Information     Murleen Arms, MD Follow up in 2 week(s).   Specialty: Orthopedic Surgery Contact information: [address] [place] [address] [phone]                  Signed: Johny Nap 05/30/2023, 6:06 PM

## 2023-05-31 ENCOUNTER — Encounter (HOSPITAL_COMMUNITY): Payer: Self-pay | Admitting: Orthopedic Surgery

## 2023-10-16 IMAGING — DX DG WRIST COMPLETE 3+V*R*
3 series · 3 of 3 positions shown · non-contrast
Comparison: None.

CLINICAL DATA: Fall

EXAM:
RIGHT WRIST - COMPLETE 3+ VIEW

[wrist pa]
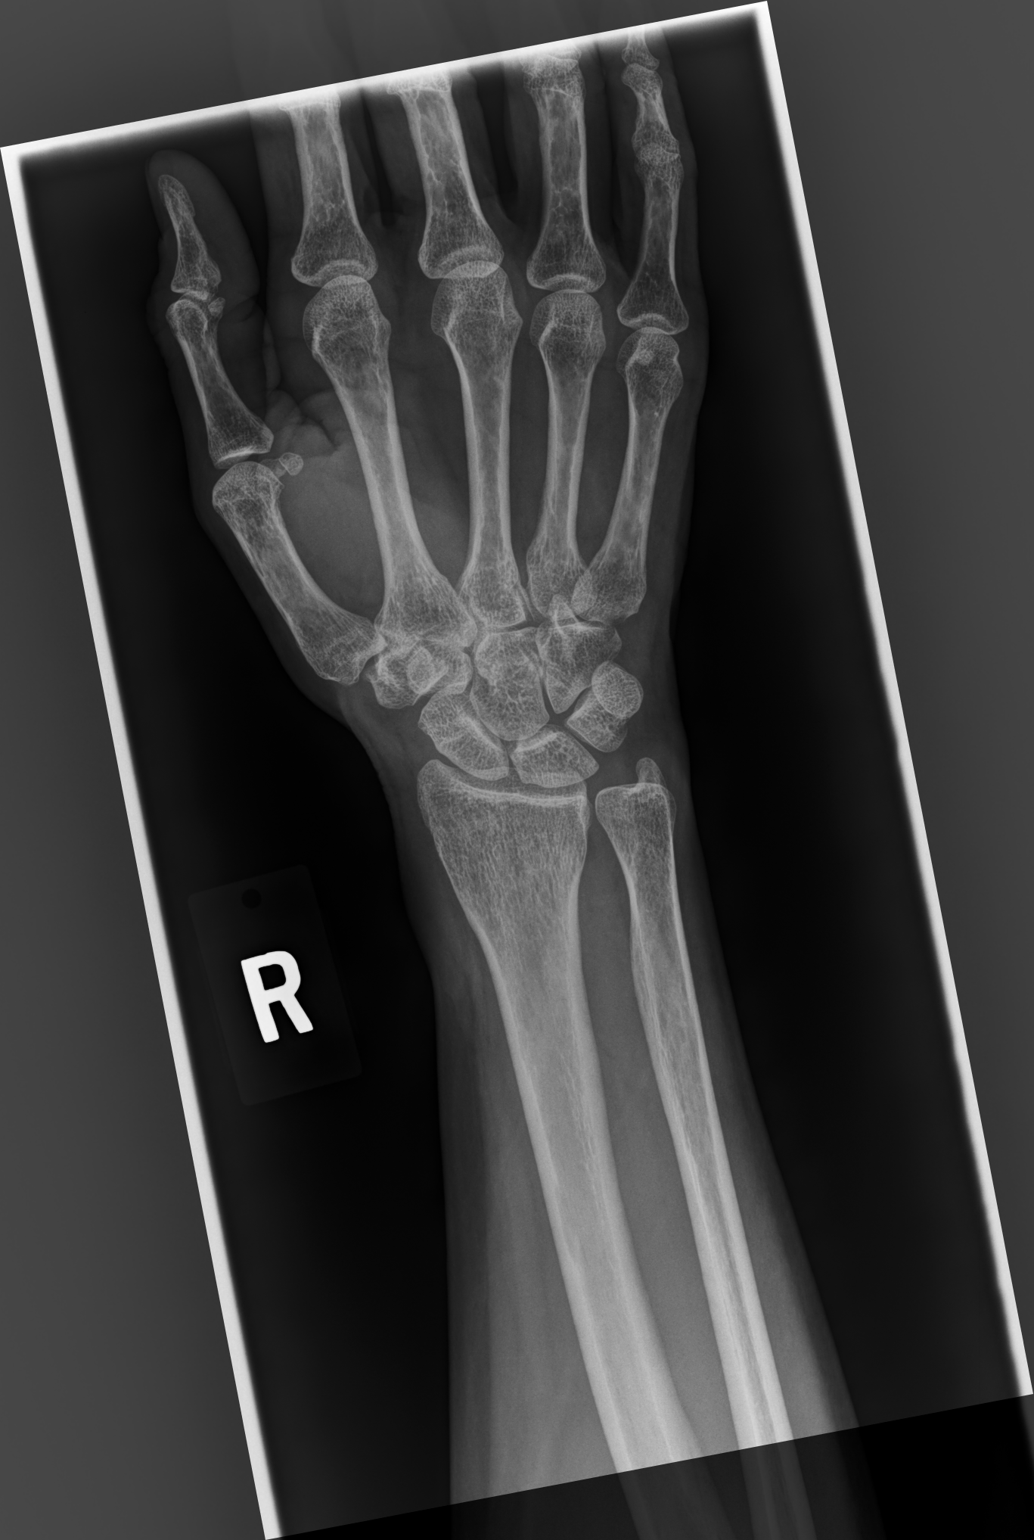

[wrist mlo]
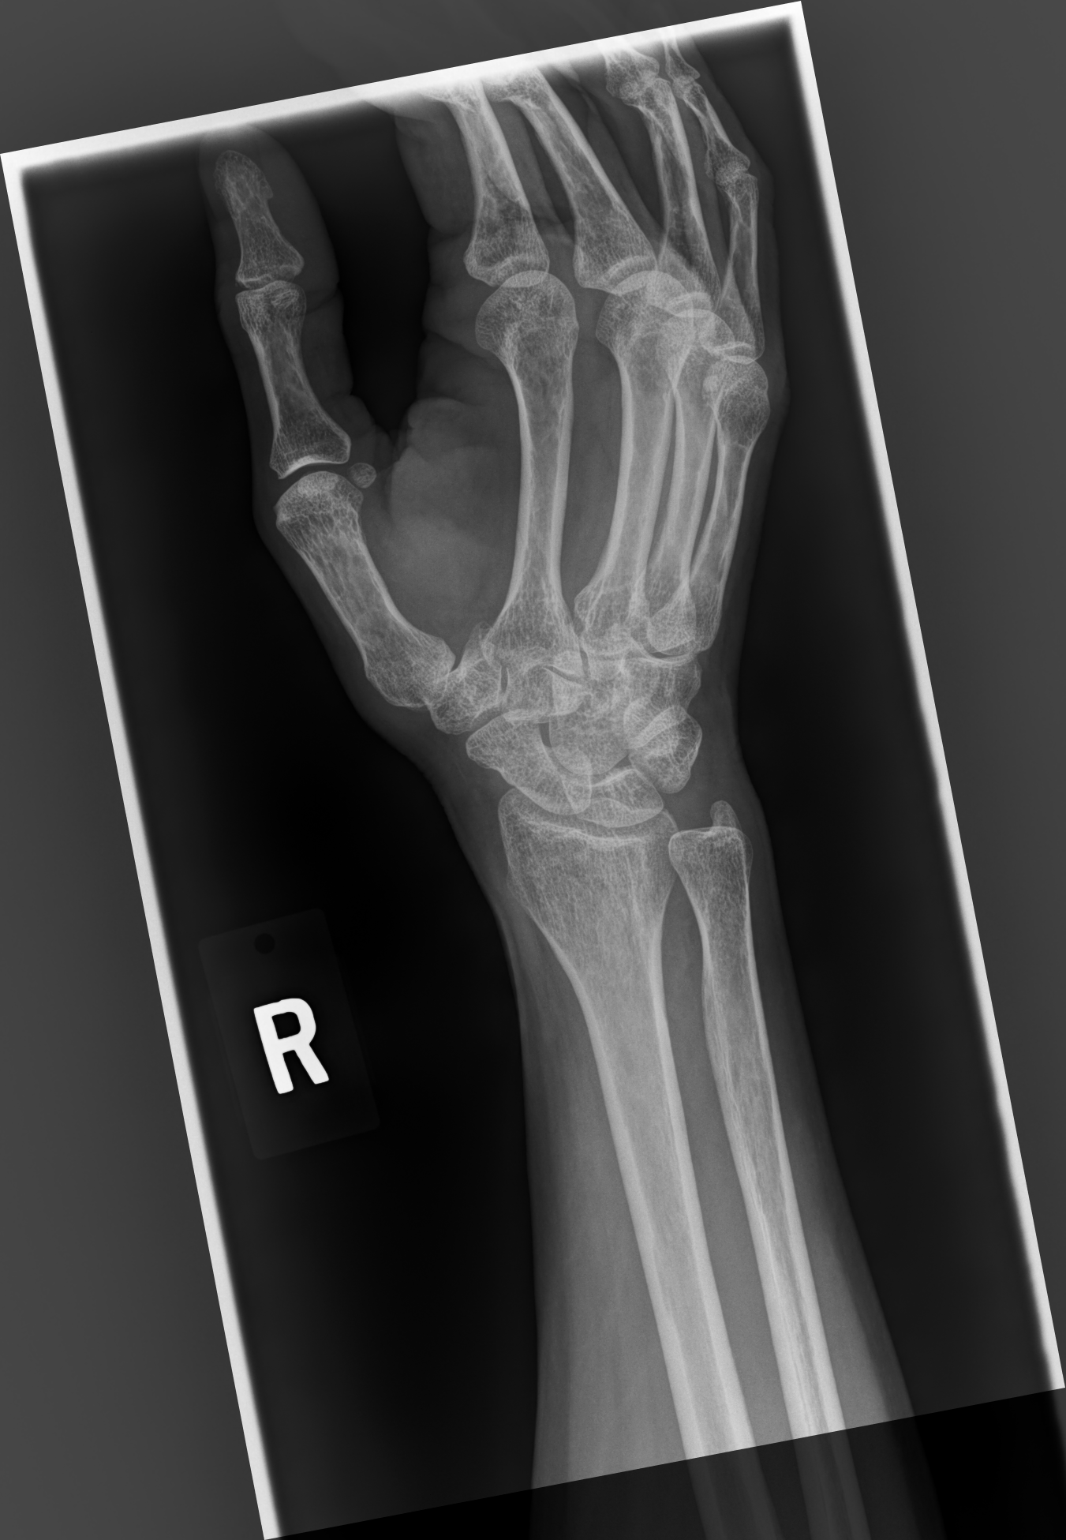

[wrist lat]
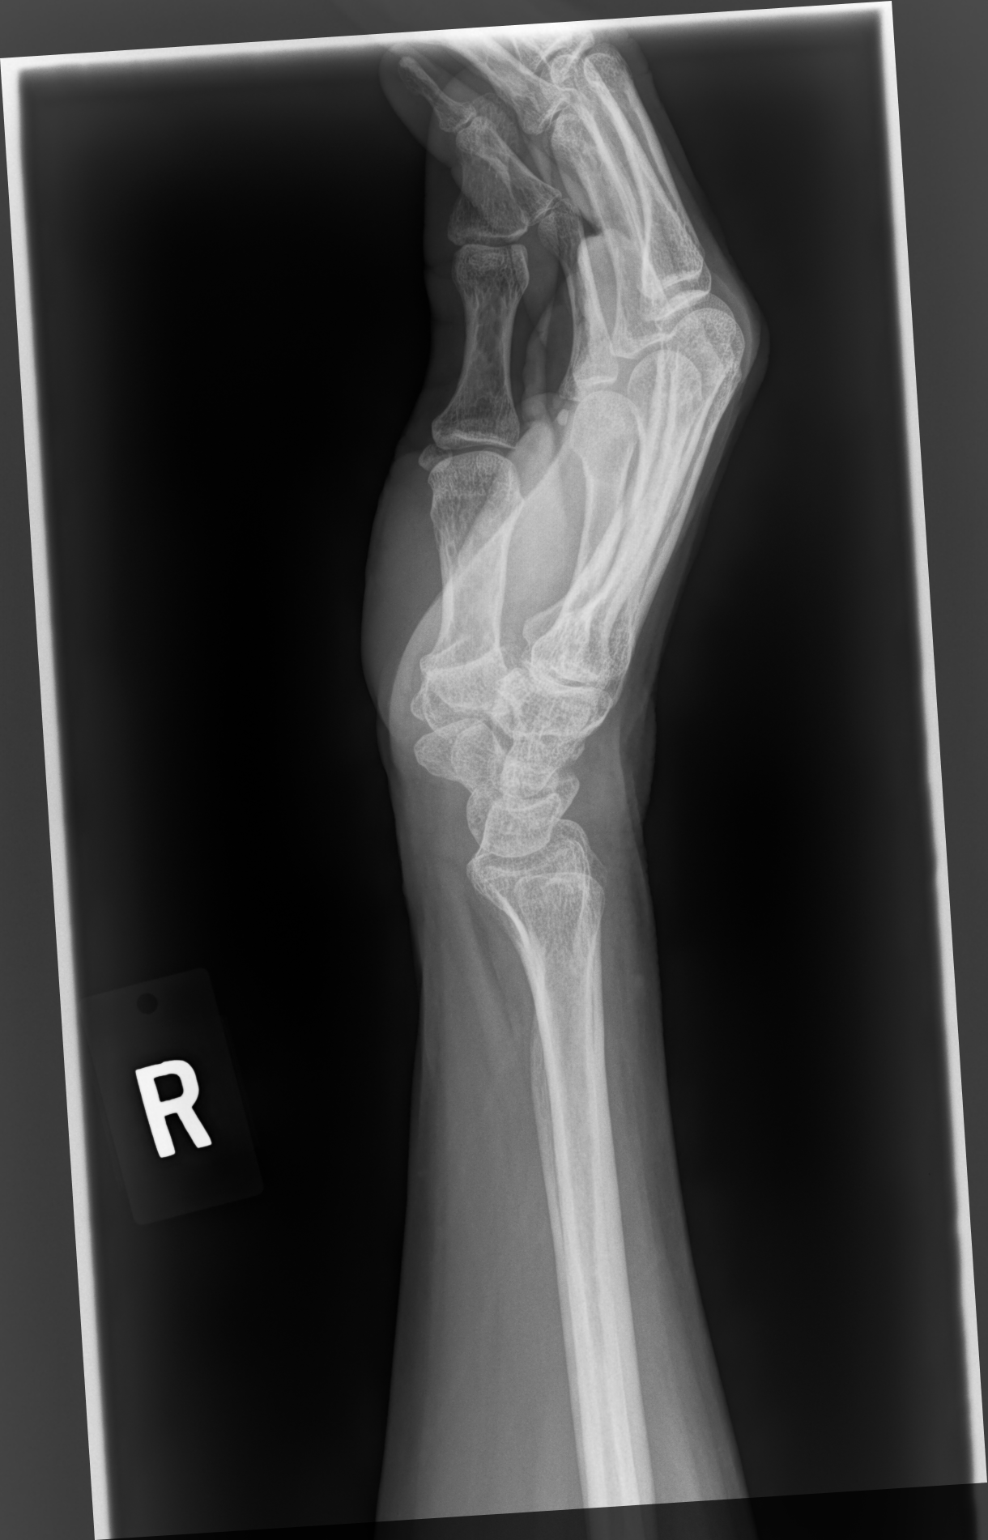

[3 of 3 positions shown; findings below may reference images not displayed]

FINDINGS: There is no evidence of fracture or dislocation. There is no
evidence of arthropathy or other focal bone abnormality. Soft
tissues are unremarkable.
IMPRESSION: Negative.
